# Patient Record
Sex: Female | Born: 1960 | Race: White | Hispanic: No | Marital: Married | Smoking: Current every day smoker
Health system: Southern US, Community
[De-identification: ages and names within clinical notes are randomized; demographics above are authoritative.]

## PROBLEM LIST (undated history)

## (undated) DIAGNOSIS — F419 Anxiety disorder, unspecified: Secondary | ICD-10-CM

## (undated) DIAGNOSIS — T7840XA Allergy, unspecified, initial encounter: Secondary | ICD-10-CM

## (undated) DIAGNOSIS — M2669 Other specified disorders of temporomandibular joint: Secondary | ICD-10-CM

## (undated) DIAGNOSIS — H269 Unspecified cataract: Secondary | ICD-10-CM

## (undated) DIAGNOSIS — M26649 Arthritis of unspecified temporomandibular joint: Secondary | ICD-10-CM

## (undated) HISTORY — DX: Other specified disorders of temporomandibular joint: M26.69

## (undated) HISTORY — PX: APPENDECTOMY: SHX54

## (undated) HISTORY — PX: COSMETIC SURGERY: SHX468

## (undated) HISTORY — DX: Anxiety disorder, unspecified: F41.9

## (undated) HISTORY — DX: Arthritis of unspecified temporomandibular joint: M26.649

## (undated) HISTORY — DX: Unspecified cataract: H26.9

## (undated) HISTORY — PX: BREAST SURGERY: SHX581

## (undated) HISTORY — DX: Allergy, unspecified, initial encounter: T78.40XA

---

## 2018-02-15 ENCOUNTER — Other Ambulatory Visit: Payer: Self-pay

## 2018-02-15 ENCOUNTER — Ambulatory Visit: Payer: Self-pay | Admitting: Family Medicine

## 2018-02-15 ENCOUNTER — Encounter: Payer: Self-pay | Admitting: Family Medicine

## 2018-02-15 VITALS — BP 108/72 | HR 86 | Temp 98.0°F | Resp 16 | Ht 67.72 in | Wt 169.0 lb

## 2018-02-15 DIAGNOSIS — J302 Other seasonal allergic rhinitis: Secondary | ICD-10-CM

## 2018-02-15 DIAGNOSIS — F411 Generalized anxiety disorder: Secondary | ICD-10-CM

## 2018-02-15 DIAGNOSIS — H00012 Hordeolum externum right lower eyelid: Secondary | ICD-10-CM

## 2018-02-15 DIAGNOSIS — R062 Wheezing: Secondary | ICD-10-CM

## 2018-02-15 MED ORDER — ALBUTEROL SULFATE HFA 108 (90 BASE) MCG/ACT IN AERS: 1.0000 | INHALATION_SPRAY | RESPIRATORY_TRACT | 0 refills | Status: DC | PRN

## 2018-02-15 MED ORDER — ERYTHROMYCIN 5 MG/GM OP OINT: 1.0000 "application " | TOPICAL_OINTMENT | Freq: Three times a day (TID) | OPHTHALMIC | 0 refills | Status: DC

## 2018-02-15 MED ORDER — LORAZEPAM 1 MG PO TABS: 1.0000 mg | ORAL_TABLET | Freq: Every day | ORAL | 0 refills | Status: DC | PRN

## 2018-02-15 NOTE — Progress Notes (Signed)
Subjective:  By signing my name below, I, Rachel Weber, attest that this documentation has been prepared under the direction and in the presence of Rachel Raspberry Ranell Patrick, MD.  Electronically Signed: Theresia Weber, Medical Scribe 02/15/18 at 2:44 PM   Patient ID: Rachel Weber, female    DOB: 01/13/1961, 57 y.o.   MRN: [id number] Chief Complaint  Patient presents with  . Establish Care    pt just moved here and need PCP  . Eye Pain    pt states something flew into right eye about 3 days ago now painful and red   HPI Rachel Weber is a 57 y.o. female who presents to [facility] is a new patient here to establish care. She reports concerns of right eye discomfort and irching. Per pt, 3 weeks ago she noticed a piece of wood in her eye and it was removed. Her house is also under construction and she reports that dust has been irritating her eye for the past 3 days. She has tried warm compresses about 5 times a day and some antibiotic drops. She endorses a hx of cataracts. She denies visual disturbance.   Anxiety She states she takes sublingual Ativan 1 mg every day or every other day. She is not on any daily SSRI. She is currently dealing with stress of moving here from [place]. She was prescribed 120 tablets on 09/18/17 and has just run out. She use to take it twice daily but has titrated down in the past 2 months.   Allergies  She states she has a reactive airway when the seasons change or she is sick with bronchitis. She uses a Salbutamol inhaler about once a week. She also notes she was diagnosed with pleurisy in the past and had wheezing where she also used the inhaler. She denies a diagnosis of asthma or COPD. She endorses tobacco use for the past 20 years.    Socially she is from [place] and dealing with visa and citizenship. She states she will be here for 6 months until 06/2018 and then in [place] for 3 months before moving back here perfectly.   There are no active problems to display  for this patient.  Past Medical History:  Diagnosis Date  . Allergy   . Anxiety   . Cataract   . TMJ arthritis    Past Surgical History:  Procedure Laterality Date  . APPENDECTOMY    . BREAST SURGERY    . COSMETIC SURGERY     No Known Allergies Prior to Admission medications   Not on File   Social History   Socioeconomic History  . Marital status: Married    Spouse name: Not on file  . Number of children: Not on file  . Years of education: Not on file  . Highest education level: Not on file  Social Needs  . Financial resource strain: Not on file  . Food insecurity - worry: Not on file  . Food insecurity - inability: Not on file  . Transportation needs - medical: Not on file  . Transportation needs - non-medical: Not on file  Occupational History  . Not on file  Tobacco Use  . Smoking status: Current Every Day Smoker  . Smokeless tobacco: Never Used  Substance and Sexual Activity  . Alcohol use: Not on file  . Drug use: Not on file  . Sexual activity: Not on file  Other Topics Concern  . Not on file  Social History Narrative  .  Not on file   Review of Systems  Eyes: Positive for redness and itching. Negative for visual disturbance.  Psychiatric/Behavioral: The patient is nervous/anxious.        Objective:   Physical Exam  Constitutional: She is oriented to person, place, and time. She appears well-developed and well-nourished. No distress.  HENT:  Head: Normocephalic and atraumatic.  Eyes: Pupils are equal, round, and reactive to light.  Appears to have a stye at the lower middle lid on the right. Right Scleral injection.  Anterior chamber clear,  Proparacaine 2 gtts applied,with less soreness after gtts. Lids everted, no foreign body identified. Fluorescein applied, no uptake.  Flushed with sterile water, no complications.   Cardiovascular: Normal rate and regular rhythm.  Pulmonary/Chest: Effort normal. No respiratory distress. She has no wheezes.    Neurological: She is alert and oriented to person, place, and time.  Skin: She is not diaphoretic.   Vitals:   02/15/18 1327  BP: 108/72  Pulse: 86  Resp: 16  Temp: 98 F (36.7 C)  TempSrc: Oral  SpO2: 96%  Weight: 169 lb (76.7 kg)  Height: 5' 7.72" (1.72 m)       Visual Acuity Screening   Right eye Left eye Both eyes  Without correction: 20/50 20/40 20/40   With correction:       Assessment & Plan:   Rachel Weber is a 57 y.o. female Generalized anxiety disorder - Plan: LORazepam (ATIVAN) 1 MG tablet  - Discussed likely need for daily SSRI if frequent benzodiazepine dosing. Plans to return to discuss the symptoms and history further. Agreed to refill Ativan temporarily for now.  Wheezing - Plan: albuterol (PROVENTIL HFA;VENTOLIN HFA) 108 (90 Base) MCG/ACT inhaler  -Intermittent symptoms. New albuterol prescription provided but RTC precautions given if frequent use as may need inhaled corticosteroid. Trigger avoidance during allergy season discussed  Seasonal allergies  -Symptomatic care and avoidance measures discussed as above and handout given  Hordeolum externum of right lower eyelid - Plan: erythromycin (ROMYCIN) ophthalmic ointment  -Suspected early stye. Less likely infectious, but erythromycin ointment given, warm compresses discussed and RTC precautions  Meds ordered this encounter  Medications  . LORazepam (ATIVAN) 1 MG tablet    Sig: Take 1 tablet (1 mg total) by mouth daily as needed for anxiety.    Dispense:  30 tablet    Refill:  0  . albuterol (PROVENTIL HFA;VENTOLIN HFA) 108 (90 Base) MCG/ACT inhaler    Sig: Inhale 1-2 puffs into the lungs every 4 (four) hours as needed for wheezing or shortness of breath.    Dispense:  1 Inhaler    Refill:  0  . erythromycin (ROMYCIN) ophthalmic ointment    Sig: Place 1 application into the right eye 3 (three) times daily.    Dispense:  3.5 g    Refill:  0   Patient Instructions   I will refill the Ativan for now,  but follow up before that runs out to discuss medications and anxiety treatment.   Albuterol if needed for wheezing.  If you require that more than 2 times per week or any nighttime symptoms - please return to discuss other treatment. At some point it would be helpful to do breathing tests to determine cause of symptoms (I.e asthma or COPD).   For allergies, try flonase nasal spray or over the counter antihistamine such as claritin if needed.    Area on lower lid appears to be a stye. See information below. Erythromycin ointment up to  3 times per day for now. If worsening symptoms, return for recheck. Warm compresses at least 3 times per day.  Stye A stye is a bump on your eyelid caused by a bacterial infection. A stye can form inside the eyelid (internal stye) or outside the eyelid (external stye). An internal stye may be caused by an infected oil-producing gland inside your eyelid. An external stye may be caused by an infection at the base of your eyelash (hair follicle). Styes are very common. Anyone can get them at any age. They usually occur in just one eye, but you may have more than one in either eye. What are the causes? The infection is almost always caused by bacteria called Staphylococcus aureus. This is a common type of bacteria that lives on your skin. What increases the risk? You may be at higher risk for a stye if you have had one before. You may also be at higher risk if you have:  Diabetes.  Long-term illness.  Long-term eye redness.  A skin condition called seborrhea.  High fat levels in your blood (lipids).  What are the signs or symptoms? Eyelid pain is the most common symptom of a stye. Internal styes are more painful than external styes. Other signs and symptoms may include:  Painful swelling of your eyelid.  A scratchy feeling in your eye.  Tearing and redness of your eye.  Pus draining from the stye.  How is this diagnosed? Your health care provider may  be able to diagnose a stye just by examining your eye. The health care provider may also check to make sure:  You do not have a fever or other signs of a more serious infection.  The infection has not spread to other parts of your eye or areas around your eye.  How is this treated? Most styes will clear up in a few days without treatment. In some cases, you may need to use antibiotic drops or ointment to prevent infection. Your health care provider may have to drain the stye surgically if your stye is:  Large.  Causing a lot of pain.  Interfering with your vision.  This can be done using a thin blade or a needle. Follow these instructions at home:  Take medicines only as directed by your health care provider.  Apply a clean, warm compress to your eye for 10 minutes, 4 times a day.  Do not wear contact lenses or eye makeup until your stye has healed.  Do not try to pop or drain the stye. Contact a health care provider if:  You have chills or a fever.  Your stye does not go away after several days.  Your stye affects your vision.  Your eyeball becomes swollen, red, or painful. This information is not intended to replace advice given to you by your health care provider. Make sure you discuss any questions you have with your health care provider. Document Released: 09/14/2005 Document Revised: 07/31/2016 Document Reviewed: 03/21/2014 Elsevier Interactive Patient Education  2018 [employer].   Allergic Rhinitis, Adult Allergic rhinitis is an allergic reaction that affects the mucous membrane inside the nose. It causes sneezing, a runny or stuffy nose, and the feeling of mucus going down the back of the throat (postnasal drip). Allergic rhinitis can be mild to severe. There are two types of allergic rhinitis:  Seasonal. This type is also called hay fever. It happens only during certain seasons.  Perennial. This type can happen at any time of the  year.  What are the  causes? This condition happens when the body's defense system (immune system) responds to certain harmless substances called allergens as though they were germs.  Seasonal allergic rhinitis is triggered by pollen, which can come from grasses, trees, and weeds. Perennial allergic rhinitis may be caused by:  House dust mites.  Pet dander.  Mold spores.  What are the signs or symptoms? Symptoms of this condition include:  Sneezing.  Runny or stuffy nose (nasal congestion).  Postnasal drip.  Itchy nose.  Tearing of the eyes.  Trouble sleeping.  Daytime sleepiness.  How is this diagnosed? This condition may be diagnosed based on:  Your medical history.  A physical exam.  Tests to check for related conditions, such as: ? Asthma. ? Pink eye. ? Ear infection. ? Upper respiratory infection.  Tests to find out which allergens trigger your symptoms. These may include skin or blood tests.  How is this treated? There is no cure for this condition, but treatment can help control symptoms. Treatment may include:  Taking medicines that block allergy symptoms, such as antihistamines. Medicine may be given as a shot, nasal spray, or pill.  Avoiding the allergen.  Desensitization. This treatment involves getting ongoing shots until your body becomes less sensitive to the allergen. This treatment may be done if other treatments do not help.  If taking medicine and avoiding the allergen does not work, new, stronger medicines may be prescribed.  Follow these instructions at home:  Find out what you are allergic to. Common allergens include smoke, dust, and pollen.  Avoid the things you are allergic to. These are some things you can do to help avoid allergens: ? Replace carpet with wood, tile, or vinyl flooring. Carpet can trap dander and dust. ? Do not smoke. Do not allow smoking in your home. ? Change your heating and air conditioning filter at least once a month. ? During  allergy season:  Keep windows closed as much as possible.  Plan outdoor activities when pollen counts are lowest. This is usually during the evening hours.  When coming indoors, change clothing and shower before sitting on furniture or bedding.  Take over-the-counter and prescription medicines only as told by your health care provider.  Keep all follow-up visits as told by your health care provider. This is important. Contact a health care provider if:  You have a fever.  You develop a persistent cough.  You make whistling sounds when you breathe (you wheeze).  Your symptoms interfere with your normal daily activities. Get help right away if:  You have shortness of breath. Summary  This condition can be managed by taking medicines as directed and avoiding allergens.  Contact your health care provider if you develop a persistent cough or fever.  During allergy season, keep windows closed as much as possible. This information is not intended to replace advice given to you by your health care provider. Make sure you discuss any questions you have with your health care provider. Document Released: 08/30/2001 Document Revised: 01/12/2017 Document Reviewed: 01/12/2017 Elsevier Interactive Patient Education  2018 [employer].     IF you received an x-ray today, you will receive an invoice from [facility]. Please contact [facility] at [phone] with questions or concerns regarding your invoice.   IF you received labwork today, you will receive an invoice from [place]. Please contact [facility] at [phone] with questions or concerns regarding your invoice.   Our billing staff will not be able to  assist you with questions regarding bills from these companies.  You will be contacted with the lab results as soon as they are available. The fastest way to get your results is to activate your My Chart account. Instructions are located on the last page of this  paperwork. If you have not heard from [place] regarding the results in 2 weeks, please contact this office.         I personally performed the services described in this documentation, which was scribed in my presence. The recorded information has been reviewed and considered for accuracy and completeness, addended by me as needed, and agree with information above.  Signed,   Merri Ray, MD [facility].  02/17/18 11:53 AM

## 2018-02-15 NOTE — Patient Instructions (Addendum)
I will refill the Ativan for now, but follow up before that runs out to discuss medications and anxiety treatment.   Albuterol if needed for wheezing.  If you require that more than 2 times per week or any nighttime symptoms - please return to discuss other treatment. At some point it would be helpful to do breathing tests to determine cause of symptoms (I.e asthma or COPD).   For allergies, try flonase nasal spray or over the counter antihistamine such as claritin if needed.    Area on lower lid appears to be a stye. See information below. Erythromycin ointment up to 3 times per day for now. If worsening symptoms, return for recheck. Warm compresses at least 3 times per day.  Stye A stye is a bump on your eyelid caused by a bacterial infection. A stye can form inside the eyelid (internal stye) or outside the eyelid (external stye). An internal stye may be caused by an infected oil-producing gland inside your eyelid. An external stye may be caused by an infection at the base of your eyelash (hair follicle). Styes are very common. Anyone can get them at any age. They usually occur in just one eye, but you may have more than one in either eye. What are the causes? The infection is almost always caused by bacteria called Staphylococcus aureus. This is a common type of bacteria that lives on your skin. What increases the risk? You may be at higher risk for a stye if you have had one before. You may also be at higher risk if you have:  Diabetes.  Long-term illness.  Long-term eye redness.  A skin condition called seborrhea.  High fat levels in your blood (lipids).  What are the signs or symptoms? Eyelid pain is the most common symptom of a stye. Internal styes are more painful than external styes. Other signs and symptoms may include:  Painful swelling of your eyelid.  A scratchy feeling in your eye.  Tearing and redness of your eye.  Pus draining from the stye.  How is this  diagnosed? Your health care provider may be able to diagnose a stye just by examining your eye. The health care provider may also check to make sure:  You do not have a fever or other signs of a more serious infection.  The infection has not spread to other parts of your eye or areas around your eye.  How is this treated? Most styes will clear up in a few days without treatment. In some cases, you may need to use antibiotic drops or ointment to prevent infection. Your health care provider may have to drain the stye surgically if your stye is:  Large.  Causing a lot of pain.  Interfering with your vision.  This can be done using a thin blade or a needle. Follow these instructions at home:  Take medicines only as directed by your health care provider.  Apply a clean, warm compress to your eye for 10 minutes, 4 times a day.  Do not wear contact lenses or eye makeup until your stye has healed.  Do not try to pop or drain the stye. Contact a health care provider if:  You have chills or a fever.  Your stye does not go away after several days.  Your stye affects your vision.  Your eyeball becomes swollen, red, or painful. This information is not intended to replace advice given to you by your health care provider. Make sure you discuss any  questions you have with your health care provider. Document Released: 09/14/2005 Document Revised: 07/31/2016 Document Reviewed: 03/21/2014 Elsevier Interactive Patient Education  2018 [employer].   Allergic Rhinitis, Adult Allergic rhinitis is an allergic reaction that affects the mucous membrane inside the nose. It causes sneezing, a runny or stuffy nose, and the feeling of mucus going down the back of the throat (postnasal drip). Allergic rhinitis can be mild to severe. There are two types of allergic rhinitis:  Seasonal. This type is also called hay fever. It happens only during certain seasons.  Perennial. This type can happen at any  time of the year.  What are the causes? This condition happens when the body's defense system (immune system) responds to certain harmless substances called allergens as though they were germs.  Seasonal allergic rhinitis is triggered by pollen, which can come from grasses, trees, and weeds. Perennial allergic rhinitis may be caused by:  House dust mites.  Pet dander.  Mold spores.  What are the signs or symptoms? Symptoms of this condition include:  Sneezing.  Runny or stuffy nose (nasal congestion).  Postnasal drip.  Itchy nose.  Tearing of the eyes.  Trouble sleeping.  Daytime sleepiness.  How is this diagnosed? This condition may be diagnosed based on:  Your medical history.  A physical exam.  Tests to check for related conditions, such as: ? Asthma. ? Pink eye. ? Ear infection. ? Upper respiratory infection.  Tests to find out which allergens trigger your symptoms. These may include skin or blood tests.  How is this treated? There is no cure for this condition, but treatment can help control symptoms. Treatment may include:  Taking medicines that block allergy symptoms, such as antihistamines. Medicine may be given as a shot, nasal spray, or pill.  Avoiding the allergen.  Desensitization. This treatment involves getting ongoing shots until your body becomes less sensitive to the allergen. This treatment may be done if other treatments do not help.  If taking medicine and avoiding the allergen does not work, new, stronger medicines may be prescribed.  Follow these instructions at home:  Find out what you are allergic to. Common allergens include smoke, dust, and pollen.  Avoid the things you are allergic to. These are some things you can do to help avoid allergens: ? Replace carpet with wood, tile, or vinyl flooring. Carpet can trap dander and dust. ? Do not smoke. Do not allow smoking in your home. ? Change your heating and air conditioning filter at  least once a month. ? During allergy season:  Keep windows closed as much as possible.  Plan outdoor activities when pollen counts are lowest. This is usually during the evening hours.  When coming indoors, change clothing and shower before sitting on furniture or bedding.  Take over-the-counter and prescription medicines only as told by your health care provider.  Keep all follow-up visits as told by your health care provider. This is important. Contact a health care provider if:  You have a fever.  You develop a persistent cough.  You make whistling sounds when you breathe (you wheeze).  Your symptoms interfere with your normal daily activities. Get help right away if:  You have shortness of breath. Summary  This condition can be managed by taking medicines as directed and avoiding allergens.  Contact your health care provider if you develop a persistent cough or fever.  During allergy season, keep windows closed as much as possible. This information is not intended to replace  advice given to you by your health care provider. Make sure you discuss any questions you have with your health care provider. Document Released: 08/30/2001 Document Revised: 01/12/2017 Document Reviewed: 01/12/2017 Elsevier Interactive Patient Education  2018 [employer].     IF you received an x-ray today, you will receive an invoice from [facility]. Please contact [facility] at [phone] with questions or concerns regarding your invoice.   IF you received labwork today, you will receive an invoice from [place]. Please contact [facility] at [phone] with questions or concerns regarding your invoice.   Our billing staff will not be able to assist you with questions regarding bills from these companies.  You will be contacted with the lab results as soon as they are available. The fastest way to get your results is to activate your My Chart account. Instructions are  located on the last page of this paperwork. If you have not heard from [place] regarding the results in 2 weeks, please contact this office.

## 2018-02-17 DIAGNOSIS — F411 Generalized anxiety disorder: Secondary | ICD-10-CM | POA: Insufficient documentation

## 2018-02-17 DIAGNOSIS — J302 Other seasonal allergic rhinitis: Secondary | ICD-10-CM | POA: Insufficient documentation

## 2018-02-17 DIAGNOSIS — R062 Wheezing: Secondary | ICD-10-CM | POA: Insufficient documentation

## 2018-03-13 ENCOUNTER — Telehealth: Payer: Self-pay | Admitting: Family Medicine

## 2018-03-13 ENCOUNTER — Ambulatory Visit: Payer: Self-pay | Admitting: Family Medicine

## 2018-03-13 ENCOUNTER — Encounter: Payer: Self-pay | Admitting: Family Medicine

## 2018-03-13 ENCOUNTER — Other Ambulatory Visit: Payer: Self-pay

## 2018-03-13 VITALS — BP 124/77 | HR 86 | Temp 98.0°F | Resp 18 | Ht 67.56 in | Wt 171.2 lb

## 2018-03-13 DIAGNOSIS — F411 Generalized anxiety disorder: Secondary | ICD-10-CM

## 2018-03-13 DIAGNOSIS — H00012 Hordeolum externum right lower eyelid: Secondary | ICD-10-CM

## 2018-03-13 DIAGNOSIS — M2550 Pain in unspecified joint: Secondary | ICD-10-CM

## 2018-03-13 MED ORDER — LORAZEPAM 1 MG PO TABS: 1.0000 mg | ORAL_TABLET | Freq: Two times a day (BID) | ORAL | 0 refills | Status: DC | PRN

## 2018-03-13 MED ORDER — ERYTHROMYCIN 5 MG/GM OP OINT: 1.0000 "application " | TOPICAL_OINTMENT | Freq: Three times a day (TID) | OPHTHALMIC | 0 refills | Status: DC

## 2018-03-13 MED ORDER — MELOXICAM 7.5 MG PO TABS: 7.5000 mg | ORAL_TABLET | Freq: Every day | ORAL | 0 refills | Status: DC

## 2018-03-13 MED ORDER — HYDROXYZINE HCL 25 MG PO TABS: 12.5000 mg | ORAL_TABLET | Freq: Three times a day (TID) | ORAL | 0 refills | Status: DC | PRN

## 2018-03-13 NOTE — Telephone Encounter (Signed)
03/13/2018 - PATIENT SAW DR. Carlota Raspberry TODAY FOR A FOLLOW-UP VISIT ON HER ANXIETY. I TRIED TO CALL HER TO LET HER KNOW THAT DR. GREENE WANTS HER TO RETURN FOR AN OFFICE VISIT IF SHE NEEDS TO BE SEEN FOR ORTHOPAEDIC ISSUES. IF SHE CALLS BACK PLEASE ALSO UPDATE HER PHONE NUMBER. WE HAVE [phone] WHICH SAYS IT IS AN INVALID PHONE NUMBER. [place]

## 2018-03-13 NOTE — Patient Instructions (Addendum)
I can refill the antibiotic ointment, but see information on stye below. It may be worth meeting with an eye specialist to decide if treatment needs to be adjusted if current area dose not improve in next 2 weeks.   I'm sorry to hear about all the life stressors right now. Ativan as needed - up to twice per day. You can try the hydroxyzine in place of the Ativan as that may be less addictive (one or the other).  Do not combine that medicine with other sedating medications. I would consider SSRI or venlafaxine for anxiety if you continue to need the ativan or hydroxyzine most days per week. PLease follow up in next 3 months to discuss those medications further.   I did write a prescription antiinflammatory for aches and pains, but please return to discuss those areas further.   Return to the clinic or go to the nearest emergency room if any of your symptoms worsen or new symptoms occur.  Thank you for coming in today.   Stye A stye is a bump on your eyelid caused by a bacterial infection. A stye can form inside the eyelid (internal stye) or outside the eyelid (external stye). An internal stye may be caused by an infected oil-producing gland inside your eyelid. An external stye may be caused by an infection at the base of your eyelash (hair follicle). Styes are very common. Anyone can get them at any age. They usually occur in just one eye, but you may have more than one in either eye. What are the causes? The infection is almost always caused by bacteria called Staphylococcus aureus. This is a common type of bacteria that lives on your skin. What increases the risk? You may be at higher risk for a stye if you have had one before. You may also be at higher risk if you have:  Diabetes.  Long-term illness.  Long-term eye redness.  A skin condition called seborrhea.  High fat levels in your blood (lipids).  What are the signs or symptoms? Eyelid pain is the most common symptom of a stye.  Internal styes are more painful than external styes. Other signs and symptoms may include:  Painful swelling of your eyelid.  A scratchy feeling in your eye.  Tearing and redness of your eye.  Pus draining from the stye.  How is this diagnosed? Your health care provider may be able to diagnose a stye just by examining your eye. The health care provider may also check to make sure:  You do not have a fever or other signs of a more serious infection.  The infection has not spread to other parts of your eye or areas around your eye.  How is this treated? Most styes will clear up in a few days without treatment. In some cases, you may need to use antibiotic drops or ointment to prevent infection. Your health care provider may have to drain the stye surgically if your stye is:  Large.  Causing a lot of pain.  Interfering with your vision.  This can be done using a thin blade or a needle. Follow these instructions at home:  Take medicines only as directed by your health care provider.  Apply a clean, warm compress to your eye for 10 minutes, 4 times a day.  Do not wear contact lenses or eye makeup until your stye has healed.  Do not try to pop or drain the stye. Contact a health care provider if:  You have  chills or a fever.  Your stye does not go away after several days.  Your stye affects your vision.  Your eyeball becomes swollen, red, or painful. This information is not intended to replace advice given to you by your health care provider. Make sure you discuss any questions you have with your health care provider. Document Released: 09/14/2005 Document Revised: 07/31/2016 Document Reviewed: 03/21/2014 Elsevier Interactive Patient Education  2018 [employer].     IF you received an x-ray today, you will receive an invoice from [facility] Radiology. Please contact [facility] Radiology at [phone] with questions or concerns regarding your invoice.   IF you  received labwork today, you will receive an invoice from [facility]. Please contact [facility] at [phone] with questions or concerns regarding your invoice.   Our billing staff will not be able to assist you with questions regarding bills from these companies.  You will be contacted with the lab results as soon as they are available. The fastest way to get your results is to activate your My Chart account. Instructions are located on the last page of this paperwork. If you have not heard from [place] regarding the results in 2 weeks, please contact this office.

## 2018-03-13 NOTE — Progress Notes (Signed)
Subjective:  By signing my name below, I, Rachel Weber, attest that this documentation has been prepared under the direction and in the presence of [name], MD Electronically Signed: Ladene Artist, ED Scribe 03/13/2018 at 10:15 AM.   Patient ID: Rachel Weber, female    DOB: 1961-06-03, 57 y.o.   MRN: [id number]  Chief Complaint  Patient presents with  . Anxiety     recheck anxiety and allergies- SCEERNING DONE   HPI Rachel Weber is a 57 y.o. female who presents to Primary Care at [facility] for f/u on anxiety. Last seen 2/28 to establish care but was having R eye discomfort at that time. Suspected early stye, erythromycin ointment given. Warm compresses and RTC precautions. No uptake with staining.  Today, pt states that the first stye resolved after 2 wks but another stye started after the first one resolved. Reports mild pain to the stye on the R eye and a lot of R eye watering. She has completed the ointment and has also been using warm compresses.  Generalized Anxiety Disorder Recently moved here from [place]. Had taken ativan 1 mg previously twice daily but had titrated down in prior 2 months. #120 lasted from 09/18/17 to last visit. She was not on daily SSRI. Agreed to temporarily refill ativan 1 mg #30 with potential need for SSRI discussed.  She reports that she is currently under a lot of stress with moving here and her mother being ill and undergoing heart surgery. Pt has been taking ativan once a day and has ~5 ativan tabs remaining. She has never tried any SSRIs; states her previous PCP was completely against it as she has never had depression. Reports she has never been diagnosed with bipolar, depression or anything else other than anxiety which she states is situational. Reports she has struggled with anxiety for the past 6 months but planned to wean herself off of ativan. Also reports some fatigue, restlessness and difficulty sleeping. Pt was prescribed temazepam 15 mg that she  takes once/wk prescribed 09/02/17 #120. She has not met with a therapist due to finances but states she has a good support system. Denies SI.  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 She was taking Tylenol with codeine for foot pain from previous fracture, knee pain and shoulder pain. She has weaned herself off of Tylenol 3. Currently taking Aleve without relief. Denies h/o cardiac related issues or stomach ulcers.  Depression screen [facility] 2/9 03/13/2018 03/13/2018 02/15/2018  Decreased Interest 0 0 0  Down, Depressed, Hopeless 0 0 0  PHQ - 2 Score 0 0 0  Altered sleeping 1 - -  Tired, decreased energy 1 - -  Change in appetite 0 - -  Feeling bad or failure about yourself  0 - -  Trouble concentrating 0 - -  Moving slowly or fidgety/restless 0 - -  Suicidal thoughts 0 - -  PHQ-9 Score 2 - -  Difficult doing work/chores Not difficult at all - -   GAD 7 : Generalized Anxiety Score 03/13/2018  Nervous, Anxious, on Edge 3  Control/stop worrying 1  Worry too much - different things 0  Trouble relaxing 1  Restless 0  Easily annoyed or irritable 1  Afraid - awful might happen 0  Total GAD 7 Score 6  Anxiety Difficulty Not difficult at all   Allergic Rhinitis with Episodic Wheezing Albuterol  provided if needed with RTC precautions if persistent use. Symptomatic care discussed for allergies.  Patient Active Problem List   Diagnosis Date Noted  . Generalized anxiety disorder 02/17/2018  . Wheezing 02/17/2018  . Seasonal allergies 02/17/2018   Past Medical History:  Diagnosis Date  . Allergy   . Anxiety   . Cataract   . TMJ arthritis    Past Surgical History:  Procedure Laterality Date  . APPENDECTOMY    . BREAST SURGERY    . COSMETIC SURGERY     No Known Allergies Prior to Admission medications   Medication Sig Start Date End Date Taking? Authorizing Provider  albuterol (PROVENTIL  HFA;VENTOLIN HFA) 108 (90 Base) MCG/ACT inhaler Inhale 1-2 puffs into the lungs every 4 (four) hours as needed for wheezing or shortness of breath. 02/15/18   [name], MD  erythromycin [facility]) ophthalmic ointment Place 1 application into the right eye 3 (three) times daily. 02/15/18   [name], MD  LORazepam (ATIVAN) 1 MG tablet Take 1 tablet (1 mg total) by mouth daily as needed for anxiety. 02/15/18   [name], MD   Social History   Socioeconomic History  . Marital status: Married    Spouse name: Not on file  . Number of children: 1  . Years of education: Not on file  . Highest education level: Not on file  Occupational History  . Not on file  Social Needs  . Financial resource strain: Not on file  . Food insecurity:    Worry: Not on file    Inability: Not on file  . Transportation needs:    Medical: Not on file    Non-medical: Not on file  Tobacco Use  . Smoking status: Current Every Day Smoker    Packs/day: 0.25    Types: Cigarettes  . Smokeless tobacco: Never Used  Substance and Sexual Activity  . Alcohol use: Yes    Comment: occ  . Drug use: Not Currently  . Sexual activity: Yes  Lifestyle  . Physical activity:    Days per week: Not on file    Minutes per session: Not on file  . Stress: Not on file  Relationships  . Social connections:    Talks on phone: Not on file    Gets together: Not on file    Attends religious service: Not on file    Active member of club or organization: Not on file    Attends meetings of clubs or organizations: Not on file    Relationship status: Not on file  . Intimate partner violence:    Fear of current or ex partner: Not on file    Emotionally abused: Not on file    Physically abused: Not on file    Forced sexual activity: Not on file  Other Topics Concern  . Not on file  Social History Narrative  . Not on file   Review of Systems  Constitutional: Positive for fatigue.  Eyes: Positive for pain  (mild; stye) and discharge (watering).  Musculoskeletal: Positive for arthralgias and myalgias.  Psychiatric/Behavioral: Positive for sleep disturbance. Negative for dysphoric mood and suicidal ideas. The patient is nervous/anxious.       Objective:   Physical Exam  Constitutional: She is oriented to person, place, and time. She appears well-developed and well-nourished. No distress.  HENT:  Head: Normocephalic and atraumatic.  Eyes: Conjunctivae and EOM are normal.  Small stye on mid aspect of R lower lid. No significant erythema  or edema surrounding.  Neck: Neck supple. No tracheal deviation present.  Cardiovascular: Normal rate and regular rhythm.  Pulmonary/Chest: Effort normal and breath sounds normal. No respiratory distress.  Musculoskeletal: Normal range of motion.  Lymphadenopathy:       Head (right side): No preauricular adenopathy present.       Head (left side): No preauricular adenopathy present.  Neurological: She is alert and oriented to person, place, and time.  Skin: Skin is warm and dry.  Psychiatric: She has a normal mood and affect. Her behavior is normal.  Nursing note and vitals reviewed.  Vitals:   03/13/18 0947  BP: 124/77  Pulse: 86  Resp: 18  Temp: 98 F (36.7 C)  TempSrc: Oral  SpO2: 97%  Weight: 171 lb 3.2 oz (77.7 kg)  Height: 5' 7.56" (1.716 m)      Assessment & Plan:   Iysha Mishkin is a 57 y.o. female Polyarthralgia - Plan: meloxicam (MOBIC) 7.5 MG tablet  -Various arthralgias, and agreed to prescribe meloxicam as needed for now with follow-up to examine specific areas further as well as discuss previous treatments  Hordeolum externum of right lower eyelid - Plan: erythromycin [facility]) ophthalmic ointment  -Mild/minimal swelling, warm compresses discussed, similar treatment as last time, refilled erythromycin ointment.  If persistent symptoms or recurrent symptoms would recommend evaluation with ophthalmology  Generalized anxiety disorder -  Plan: hydrOXYzine (ATARAX/VISTARIL) 25 MG tablet, LORazepam (ATIVAN) 1 MG tablet  -Suspected generalized anxiety with worsening with situational stressors.  Long-standing anxiety, has been able to cut back somewhat on her benzodiazepine.  Discussed concerns with solitary treatment with benzodiazepine, and risk for addiction.  -For now agreed on Ativan 1 mg twice daily as needed, but also recommended trying hydroxyzine in its place as less addictive.  Would consider SSRI if persistent need for anxiolytic.  Recheck status within 3 months, sooner if worse.   Meds ordered this encounter  Medications  . erythromycin [facility]) ophthalmic ointment    Sig: Place 1 application into the right eye 3 (three) times daily.    Dispense:  3.5 g    Refill:  0  . hydrOXYzine (ATARAX/VISTARIL) 25 MG tablet    Sig: Take 0.5-1 tablets (12.5-25 mg total) by mouth 3 (three) times daily as needed for anxiety.    Dispense:  30 tablet    Refill:  0  . LORazepam (ATIVAN) 1 MG tablet    Sig: Take 1 tablet (1 mg total) by mouth 2 (two) times daily as needed for anxiety.    Dispense:  60 tablet    Refill:  0  . meloxicam (MOBIC) 7.5 MG tablet    Sig: Take 1 tablet (7.5 mg total) by mouth daily.    Dispense:  30 tablet    Refill:  0   Patient Instructions   I can refill the antibiotic ointment, but see information on stye below. It may be worth meeting with an eye specialist to decide if treatment needs to be adjusted if current area dose not improve in next 2 weeks.   I'm sorry to hear about all the life stressors right now. Ativan as needed - up to twice per day. You can try the hydroxyzine in place of the Ativan as that may be less addictive (one or the other).  Do not combine that medicine with other sedating medications. I would consider SSRI or venlafaxine for anxiety if you continue to need the ativan or hydroxyzine most days per week. PLease follow up in  next 3 months to discuss those medications further.   I  did write a prescription antiinflammatory for aches and pains, but please return to discuss those areas further.   Return to the clinic or go to the nearest emergency room if any of your symptoms worsen or new symptoms occur.  Thank you for coming in today.   Stye A stye is a bump on your eyelid caused by a bacterial infection. A stye can form inside the eyelid (internal stye) or outside the eyelid (external stye). An internal stye may be caused by an infected oil-producing gland inside your eyelid. An external stye may be caused by an infection at the base of your eyelash (hair follicle). Styes are very common. Anyone can get them at any age. They usually occur in just one eye, but you may have more than one in either eye. What are the causes? The infection is almost always caused by bacteria called Staphylococcus aureus. This is a common type of bacteria that lives on your skin. What increases the risk? You may be at higher risk for a stye if you have had one before. You may also be at higher risk if you have:  Diabetes.  Long-term illness.  Long-term eye redness.  A skin condition called seborrhea.  High fat levels in your blood (lipids).  What are the signs or symptoms? Eyelid pain is the most common symptom of a stye. Internal styes are more painful than external styes. Other signs and symptoms may include:  Painful swelling of your eyelid.  A scratchy feeling in your eye.  Tearing and redness of your eye.  Pus draining from the stye.  How is this diagnosed? Your health care provider may be able to diagnose a stye just by examining your eye. The health care provider may also check to make sure:  You do not have a fever or other signs of a more serious infection.  The infection has not spread to other parts of your eye or areas around your eye.  How is this treated? Most styes will clear up in a few days without treatment. In some cases, you may need to use antibiotic  drops or ointment to prevent infection. Your health care provider may have to drain the stye surgically if your stye is:  Large.  Causing a lot of pain.  Interfering with your vision.  This can be done using a thin blade or a needle. Follow these instructions at home:  Take medicines only as directed by your health care provider.  Apply a clean, warm compress to your eye for 10 minutes, 4 times a day.  Do not wear contact lenses or eye makeup until your stye has healed.  Do not try to pop or drain the stye. Contact a health care provider if:  You have chills or a fever.  Your stye does not go away after several days.  Your stye affects your vision.  Your eyeball becomes swollen, red, or painful. This information is not intended to replace advice given to you by your health care provider. Make sure you discuss any questions you have with your health care provider. Document Released: 09/14/2005 Document Revised: 07/31/2016 Document Reviewed: 03/21/2014 Elsevier Interactive Patient Education  2018 [employer].     IF you received an x-ray today, you will receive an invoice from [facility] Radiology. Please contact [facility] Radiology at [phone] with questions or concerns regarding your invoice.   IF you received labwork today, you will receive  an Pharmacologist from [facility]. Please contact [place] at [phone] with questions or concerns regarding your invoice.   Our billing staff will not be able to assist you with questions regarding bills from these companies.  You will be contacted with the lab results as soon as they are available. The fastest way to get your results is to activate your My Chart account. Instructions are located on the last page of this paperwork. If you have not heard from [place] regarding the results in 2 weeks, please contact this office.      I personally performed the services described in this documentation, which was scribed in my presence. The  recorded information has been reviewed and considered for accuracy and completeness, addended by me as needed, and agree with information above.  Signed,   Merri Ray, MD Primary Care at [place].  03/16/18 8:36 AM

## 2018-03-13 NOTE — Telephone Encounter (Signed)
° °  Pt returned call and said she will try something and if she find that it does not work she will call and make a appt. She said the number on file is the correct number and that it is an [place] number and sometimes you have to call the number more than once

## 2018-04-09 ENCOUNTER — Other Ambulatory Visit: Payer: Self-pay | Admitting: Family Medicine

## 2018-04-09 DIAGNOSIS — F411 Generalized anxiety disorder: Secondary | ICD-10-CM

## 2018-04-09 DIAGNOSIS — M2550 Pain in unspecified joint: Secondary | ICD-10-CM

## 2018-04-09 NOTE — Telephone Encounter (Signed)
Last filled: 03/13/18 Last OV: 03/13/18 PCP: [place]: [id number] [address], [place] [phone] (Phone) [phone] (Fax)

## 2018-04-09 NOTE — Telephone Encounter (Signed)
Please advise on refill.

## 2018-04-10 NOTE — Telephone Encounter (Signed)
meds refilled, as recent OV.

## 2018-05-09 ENCOUNTER — Other Ambulatory Visit: Payer: Self-pay | Admitting: Family Medicine

## 2018-05-09 DIAGNOSIS — F411 Generalized anxiety disorder: Secondary | ICD-10-CM

## 2018-05-09 DIAGNOSIS — M2550 Pain in unspecified joint: Secondary | ICD-10-CM

## 2018-05-09 NOTE — Telephone Encounter (Signed)
Refilled meloxicam and hydroxyzine, but should follow up in June

## 2018-05-09 NOTE — Telephone Encounter (Signed)
Refill on these 2 meds below.   Last seen 02/15/18 and 03/13/18 for anxiety and eye lip problems.  Please advise

## 2018-05-24 ENCOUNTER — Other Ambulatory Visit: Payer: Self-pay | Admitting: Family Medicine

## 2018-05-24 DIAGNOSIS — F411 Generalized anxiety disorder: Secondary | ICD-10-CM

## 2018-05-24 NOTE — Telephone Encounter (Signed)
Lorazepam refill Last OV:03/13/18 Last refill:03/13/18 0 refill QAE:SLPNPY Pharmacy: [id number] [address], [place] [phone] (Phone) [phone] (Fax)

## 2018-05-28 ENCOUNTER — Telehealth: Payer: Self-pay | Admitting: Family Medicine

## 2018-05-28 DIAGNOSIS — F411 Generalized anxiety disorder: Secondary | ICD-10-CM

## 2018-05-28 NOTE — Telephone Encounter (Signed)
Copied from [place] [phone]. Topic: Quick Communication - Rx Refill/Question >> May 28, 2018  4:10 PM Selinda Flavin B, NT wrote: Medication: LORazepam (ATIVAN) 1 MG tablet  Has the patient contacted their pharmacy? Yes.   (Agent: If no, request that the patient contact the pharmacy for the refill.) (Agent: If yes, when and what did the pharmacy advise?)  Preferred Pharmacy (with phone number or street name): [id number] [place], [place]  Agent: Please be advised that RX refills may take up to 3 business days. We ask that you follow-up with your pharmacy.

## 2018-05-29 NOTE — Telephone Encounter (Signed)
Ativan 1 mg refill request - looks like it may be pended by Dr. Carlota Raspberry.  LOV 03/13/18 with Dr. Carlota Raspberry  Last refill:  03/13/18  #60    0 refills  [id number] [address] [place] [facility] [place]

## 2018-05-29 NOTE — Telephone Encounter (Signed)
Refilled, but should be due for follow up prior to further refills.

## 2018-05-30 NOTE — Telephone Encounter (Signed)
Please advise on refill. Pt was last seen 03/13/2018.  Controlled.

## 2018-05-30 NOTE — Telephone Encounter (Signed)
Refilled yesterday. 

## 2018-06-11 ENCOUNTER — Ambulatory Visit (INDEPENDENT_AMBULATORY_CARE_PROVIDER_SITE_OTHER): Payer: Self-pay | Admitting: Family Medicine

## 2018-06-11 ENCOUNTER — Ambulatory Visit: Payer: Self-pay | Admitting: Family Medicine

## 2018-06-11 ENCOUNTER — Other Ambulatory Visit: Payer: Self-pay

## 2018-06-11 ENCOUNTER — Encounter: Payer: Self-pay | Admitting: Family Medicine

## 2018-06-11 ENCOUNTER — Telehealth: Payer: Self-pay | Admitting: Family Medicine

## 2018-06-11 DIAGNOSIS — R062 Wheezing: Secondary | ICD-10-CM

## 2018-06-11 DIAGNOSIS — F411 Generalized anxiety disorder: Secondary | ICD-10-CM

## 2018-06-11 MED ORDER — ALBUTEROL SULFATE HFA 108 (90 BASE) MCG/ACT IN AERS: 1.0000 | INHALATION_SPRAY | RESPIRATORY_TRACT | 0 refills | Status: DC | PRN

## 2018-06-11 MED ORDER — HYDROXYZINE HCL 25 MG PO TABS: 25.0000 mg | ORAL_TABLET | Freq: Three times a day (TID) | ORAL | 0 refills | Status: DC | PRN

## 2018-06-11 NOTE — Patient Instructions (Addendum)
Ok to continue hydroxyzine for anxiety, but can also take that at bedtime to help with sleep.   I will need to check into the At[place] and how that can be written with you leaving the country.   Albuterol was refilled if needed. If you require that med more often, I would recommend a different class of meds that you can use daily to lessen the need for albuterol.    Return to the clinic or go to the nearest emergency room if any of your symptoms worsen or new symptoms occur.  Insomnia Insomnia is a sleep disorder that makes it difficult to fall asleep or to stay asleep. Insomnia can cause tiredness (fatigue), low energy, difficulty concentrating, mood swings, and poor performance at work or school. There are three different ways to classify insomnia:  Difficulty falling asleep.  Difficulty staying asleep.  Waking up too early in the morning.  Any type of insomnia can be long-term (chronic) or short-term (acute). Both are common. Short-term insomnia usually lasts for three months or less. Chronic insomnia occurs at least three times a week for longer than three months. What are the causes? Insomnia may be caused by another condition, situation, or substance, such as:  Anxiety.  Certain medicines.  Gastroesophageal reflux disease (GERD) or other gastrointestinal conditions.  Asthma or other breathing conditions.  Restless legs syndrome, sleep apnea, or other sleep disorders.  Chronic pain.  Menopause. This may include hot flashes.  Stroke.  Abuse of alcohol, tobacco, or illegal drugs.  Depression.  Caffeine.  Neurological disorders, such as Alzheimer disease.  An overactive thyroid (hyperthyroidism).  The cause of insomnia may not be known. What increases the risk? Risk factors for insomnia include:  Gender. Women are more commonly affected than men.  Age. Insomnia is more common as you get older.  Stress. This may involve your professional or personal  life.  Income. Insomnia is more common in people with lower income.  Lack of exercise.  Irregular work schedule or night shifts.  Traveling between different time zones.  What are the signs or symptoms? If you have insomnia, trouble falling asleep or trouble staying asleep is the main symptom. This may lead to other symptoms, such as:  Feeling fatigued.  Feeling nervous about going to sleep.  Not feeling rested in the morning.  Having trouble concentrating.  Feeling irritable, anxious, or depressed.  How is this treated? Treatment for insomnia depends on the cause. If your insomnia is caused by an underlying condition, treatment will focus on addressing the condition. Treatment may also include:  Medicines to help you sleep.  Counseling or therapy.  Lifestyle adjustments.  Follow these instructions at home:  Take medicines only as directed by your health care provider.  Keep regular sleeping and waking hours. Avoid naps.  Keep a sleep diary to help you and your health care provider figure out what could be causing your insomnia. Include: ? When you sleep. ? When you wake up during the night. ? How well you sleep. ? How rested you feel the next day. ? Any side effects of medicines you are taking. ? What you eat and drink.  Make your bedroom a comfortable place where it is easy to fall asleep: ? Put up shades or special blackout curtains to block light from outside. ? Use a white noise machine to block noise. ? Keep the temperature cool.  Exercise regularly as directed by your health care provider. Avoid exercising right before bedtime.  Use relaxation  techniques to manage stress. Ask your health care provider to suggest some techniques that may work well for you. These may include: ? Breathing exercises. ? Routines to release muscle tension. ? Visualizing peaceful scenes.  Cut back on alcohol, caffeinated beverages, and cigarettes, especially close to bedtime.  These can disrupt your sleep.  Do not overeat or eat spicy foods right before bedtime. This can lead to digestive discomfort that can make it hard for you to sleep.  Limit screen use before bedtime. This includes: ? Watching TV. ? Using your smartphone, tablet, and computer.  Stick to a routine. This can help you fall asleep faster. Try to do a quiet activity, brush your teeth, and go to bed at the same time each night.  Get out of bed if you are still awake after 15 minutes of trying to sleep. Keep the lights down, but try reading or doing a quiet activity. When you feel sleepy, go back to bed.  Make sure that you drive carefully. Avoid driving if you feel very sleepy.  Keep all follow-up appointments as directed by your health care provider. This is important. Contact a health care provider if:  You are tired throughout the day or have trouble in your daily routine due to sleepiness.  You continue to have sleep problems or your sleep problems get worse. Get help right away if:  You have serious thoughts about hurting yourself or someone else. This information is not intended to replace advice given to you by your health care provider. Make sure you discuss any questions you have with your health care provider. Document Released: 12/02/2000 Document Revised: 05/06/2016 Document Reviewed: 09/05/2014 Elsevier Interactive Patient Education  2018 [employer].  Generalized Anxiety Disorder, Adult Generalized anxiety disorder (GAD) is a mental health disorder. People with this condition constantly worry about everyday events. Unlike normal anxiety, worry related to GAD is not triggered by a specific event. These worries also do not fade or get better with time. GAD interferes with life functions, including relationships, work, and school. GAD can vary from mild to severe. People with severe GAD can have intense waves of anxiety with physical symptoms (panic attacks). What are the  causes? The exact cause of GAD is not known. What increases the risk? This condition is more likely to develop in:  Women.  People who have a family history of anxiety disorders.  People who are very shy.  People who experience very stressful life events, such as the death of a loved one.  People who have a very stressful family environment.  What are the signs or symptoms? People with GAD often worry excessively about many things in their lives, such as their health and family. They may also be overly concerned about:  Doing well at work.  Being on time.  Natural disasters.  Friendships.  Physical symptoms of GAD include:  Fatigue.  Muscle tension or having muscle twitches.  Trembling or feeling shaky.  Being easily startled.  Feeling like your heart is pounding or racing.  Feeling out of breath or like you cannot take a deep breath.  Having trouble falling asleep or staying asleep.  Sweating.  Nausea, diarrhea, or irritable bowel syndrome (IBS).  Headaches.  Trouble concentrating or remembering facts.  Restlessness.  Irritability.  How is this diagnosed? Your health care provider can diagnose GAD based on your symptoms and medical history. You will also have a physical exam. The health care provider will ask specific questions about your symptoms,  including how severe they are, when they started, and if they come and go. Your health care provider may ask you about your use of alcohol or drugs, including prescription medicines. Your health care provider may refer you to a mental health specialist for further evaluation. Your health care provider will do a thorough examination and may perform additional tests to rule out other possible causes of your symptoms. To be diagnosed with GAD, a person must have anxiety that:  Is out of his or her control.  Affects several different aspects of his or her life, such as work and relationships.  Causes distress  that makes him or her unable to take part in normal activities.  Includes at least three physical symptoms of GAD, such as restlessness, fatigue, trouble concentrating, irritability, muscle tension, or sleep problems.  Before your health care provider can confirm a diagnosis of GAD, these symptoms must be present more days than they are not, and they must last for six months or longer. How is this treated? The following therapies are usually used to treat GAD:  Medicine. Antidepressant medicine is usually prescribed for long-term daily control. Antianxiety medicines may be added in severe cases, especially when panic attacks occur.  Talk therapy (psychotherapy). Certain types of talk therapy can be helpful in treating GAD by providing support, education, and guidance. Options include: ? Cognitive behavioral therapy (CBT). People learn coping skills and techniques to ease their anxiety. They learn to identify unrealistic or negative thoughts and behaviors and to replace them with positive ones. ? Acceptance and commitment therapy (ACT). This treatment teaches people how to be mindful as a way to cope with unwanted thoughts and feelings. ? Biofeedback. This process trains you to manage your body's response (physiological response) through breathing techniques and relaxation methods. You will work with a therapist while machines are used to monitor your physical symptoms.  Stress management techniques. These include yoga, meditation, and exercise.  A mental health specialist can help determine which treatment is best for you. Some people see improvement with one type of therapy. However, other people require a combination of therapies. Follow these instructions at home:  Take over-the-counter and prescription medicines only as told by your health care provider.  Try to maintain a normal routine.  Try to anticipate stressful situations and allow extra time to manage them.  Practice any stress  management or self-calming techniques as taught by your health care provider.  Do not punish yourself for setbacks or for not making progress.  Try to recognize your accomplishments, even if they are small.  Keep all follow-up visits as told by your health care provider. This is important. Contact a health care provider if:  Your symptoms do not get better.  Your symptoms get worse.  You have signs of depression, such as: ? A persistently sad, cranky, or irritable mood. ? Loss of enjoyment in activities that used to bring you joy. ? Change in weight or eating. ? Changes in sleeping habits. ? Avoiding friends or family members. ? Loss of energy for normal tasks. ? Feelings of guilt or worthlessness. Get help right away if:  You have serious thoughts about hurting yourself or others. If you ever feel like you may hurt yourself or others, or have thoughts about taking your own life, get help right away. You can go to your nearest emergency department or call:  Your local emergency services (911 in the [place]  A suicide crisis helpline, such as the National Suicide Prevention  Lifeline at 646-531-8961. This is open 24 hours a day.  Summary  Generalized anxiety disorder (GAD) is a mental health disorder that involves worry that is not triggered by a specific event.  People with GAD often worry excessively about many things in their lives, such as their health and family.  GAD may cause physical symptoms such as restlessness, trouble concentrating, sleep problems, frequent sweating, nausea, diarrhea, headaches, and trembling or muscle twitching.  A mental health specialist can help determine which treatment is best for you. Some people see improvement with one type of therapy. However, other people require a combination of therapies. This information is not intended to replace advice given to you by your health care provider. Make sure you discuss any questions you have with your  health care provider. Document Released: 04/01/2013 Document Revised: 10/25/2016 Document Reviewed: 10/25/2016 Elsevier Interactive Patient Education  2018 [employer].    IF you received an x-ray today, you will receive an invoice from [facility]. Please contact [facility] at [phone] with questions or concerns regarding your invoice.   IF you received labwork today, you will receive an invoice from [facility]. Please contact [facility] at [phone] with questions or concerns regarding your invoice.   Our billing staff will not be able to assist you with questions regarding bills from these companies.  You will be contacted with the lab results as soon as they are available. The fastest way to get your results is to activate your My Chart account. Instructions are located on the last page of this paperwork. If you have not heard from [place] regarding the results in 2 weeks, please contact this office.

## 2018-06-11 NOTE — Telephone Encounter (Signed)
06/11/2018 - IF THIS PATIENT SHOULD CALL BACK FOR ANY REASON PLEASE GET HER UPDATED TELEPHONE NUMBER. THE ONE ON HER REGISTRATION IS SAYING THAT IT IS NOT A VALID NUMBER. PLEASE HAVE HER TO SPEAK TO MARY C. AT [facility] [place]. SHE MAY ALSO CALL TO SPEAK TO [facility] SO THAT SHE CAN PRINT HER AN ITEMIZED BILL. [place]

## 2018-06-11 NOTE — Progress Notes (Signed)
Subjective:    Patient ID: Rachel Weber, female    DOB: 07-11-1961, 57 y.o.   MRN: [id number]  HPI Rachel Weber is a 57 y.o. female Presents today for: Chief Complaint  Patient presents with  . Medication Refill    ativan, abluterol, and a sleep medication. due to going back to [place]    Here for follow-up and medication refills.  Generalized anxiety disorder: Initially seen in February.  They have been using Ativan 1 mg every 1 to 2 days.  Was having some situational stressors with moving from [place] and with her mother's health.  Had titrated her Ativan down from twice per day to once per day over the prior few months.  We had also discussed option of hydroxyzine -25 mg tablets to help with either anxiety or sleep.  We had discussed meeting with a therapist, but that was cost prohibitive and she felt like she had a good support system.  Plans on moving back to [place] in 8 days.  Not sure how long she will be there. May be meeting with immigration between now and August 3rd to decide on timing of return, may be another 3 months. Will be going to [place] to work and live with family, does not have physician there and is requesting 3 month prescription of Ativan as otherwise would need to be seen in emergency room.  Has tried taking 1 Ativan every other day with 2 hydroxyzine on days not taking Ativan. Uncle passed 2 weeks ago and unable to go to his funeral.  Has taken 1 ativan per day for past 2 weeks.    Terrified of side effects of SSRI's - will not take those meds.   Also requesting refill of temazepam 15mg  - takes 2 -3 nights per week. Presents bottle from 09/04/17 for #120, with 1 left.   Last hydroxyzine prescription 05/09/2018 for #30.  Last Ativan prescription for #60 on 05/29/2018.  We did discuss potential SSRI if frequent benzodiazepine dosing dosing or need.  2 of the hydroxyzine has been helpful with anxiety, but has not tried those to help with sleep.   Has been exercising 4-5 days per  week.   Bronchospasm: See prior visits, mild intermittent symptoms at that time with occasional albuterol.  Discussed potential inhaled corticosteroid if frequent use.  Also discussed symptomatic care and avoidance measures for seasonal allergies. Can go a week without the albuterol, some days may need it more than once. Typically uses once or twice per week.    CSIR reviewed - consistent with ordered meds, no additional Rx's.   Patient Active Problem List   Diagnosis Date Noted  . Generalized anxiety disorder 02/17/2018  . Wheezing 02/17/2018  . Seasonal allergies 02/17/2018   Past Medical History:  Diagnosis Date  . Allergy   . Anxiety   . Cataract   . TMJ arthritis    Past Surgical History:  Procedure Laterality Date  . APPENDECTOMY    . BREAST SURGERY    . COSMETIC SURGERY     No Known Allergies Prior to Admission medications   Medication Sig Start Date End Date Taking? Authorizing Provider  albuterol (PROVENTIL HFA;VENTOLIN HFA) 108 (90 Base) MCG/ACT inhaler Inhale 1-2 puffs into the lungs every 4 (four) hours as needed for wheezing or shortness of breath. 02/15/18  Yes [name], MD  hydrOXYzine (ATARAX/VISTARIL) 25 MG tablet TAKE 0.5-1 TABLETS (12.5-25 MG TOTAL) BY MOUTH 3 (THREE) TIMES DAILY AS NEEDED FOR ANXIETY. 05/09/18  Yes  [name], MD  LORazepam (ATIVAN) 1 MG tablet TAKE 1 TABLET (1 MG TOTAL) BY MOUTH 2 (TWO) TIMES DAILY AS NEEDED FOR ANXIETY. 05/29/18  Yes [name], MD  meloxicam (MOBIC) 7.5 MG tablet TAKE 1 TABLET BY MOUTH EVERY DAY 05/09/18  Yes [name], MD   Social History   Socioeconomic History  . Marital status: Married    Spouse name: Not on file  . Number of children: 1  . Years of education: Not on file  . Highest education level: Not on file  Occupational History  . Not on file  Social Needs  . Financial resource strain: Not on file  . Food insecurity:    Worry: Not on file    Inability: Not on file  .  Transportation needs:    Medical: Not on file    Non-medical: Not on file  Tobacco Use  . Smoking status: Current Every Day Smoker    Packs/day: 0.25    Types: Cigarettes  . Smokeless tobacco: Never Used  Substance and Sexual Activity  . Alcohol use: Yes    Comment: occ  . Drug use: Not Currently  . Sexual activity: Yes  Lifestyle  . Physical activity:    Days per week: Not on file    Minutes per session: Not on file  . Stress: Not on file  Relationships  . Social connections:    Talks on phone: Not on file    Gets together: Not on file    Attends religious service: Not on file    Active member of club or organization: Not on file    Attends meetings of clubs or organizations: Not on file    Relationship status: Not on file  . Intimate partner violence:    Fear of current or ex partner: Not on file    Emotionally abused: Not on file    Physically abused: Not on file    Forced sexual activity: Not on file  Other Topics Concern  . Not on file  Social History Narrative  . Not on file    Review of Systems     Objective:   Physical Exam  Constitutional: She is oriented to person, place, and time. She appears well-developed and well-nourished. No distress.  HENT:  Head: Normocephalic and atraumatic.  Eyes: Pupils are equal, round, and reactive to light. Conjunctivae and EOM are normal.  Neck: Carotid bruit is not present.  Cardiovascular: Normal rate, regular rhythm, normal heart sounds and intact distal pulses.  Pulmonary/Chest: Effort normal and breath sounds normal.  Abdominal: Soft. She exhibits no pulsatile midline mass. There is no tenderness.  Neurological: She is alert and oriented to person, place, and time.  Skin: Skin is warm and dry.  Psychiatric: She has a normal mood and affect. Her speech is normal and behavior is normal. Thought content normal.  Vitals reviewed.   Vitals:   06/11/18 1421  BP: 118/78  Pulse: 90  Temp: 97.7 F (36.5 C)  TempSrc:  Oral  SpO2: 94%  Weight: 172 lb (78 kg)  Height: 5\' 9"  (1.753 m)       Assessment & Plan:    Rachel Weber is a 57 y.o. female Generalized anxiety disorder - Plan: hydrOXYzine (ATARAX/VISTARIL) 25 MG tablet  -Had been improving with ability to taper back on Ativan but some recent increased stressors as above.  Did have some relief with use of hydroxyzine.  Will be traveling to [place] in the next 8  days, and will have some difficulty seeking care initially other than through emergency room reportedly.  -Reviewed controlled substance reporting system and consistent with prescriptions provided here.  Will provide additional #60 of Ativan but discussed trying hydroxyzine in place of Ativan and that was also refilled.  Would still consider SSRI if daily use of benzodiazepine, but that was deferred at this point.  She plans on possibly returning here in the next few months and we can discuss that regimen upon return if still requiring daily medication.  -I did refill the hydroxyzine during her visit, but called after the visit to discuss Ativan refill.  Unfortunately her home phone number does not work.  I advised her emergency contact Collier Salina to have her call us to review this plan.  Let me know if there are questions.  Wheezing - Plan: albuterol (PROVENTIL HFA;VENTOLIN HFA) 108 (90 Base) MCG/ACT inhaler  -Albuterol refilled for as needed use.  Still appears to be overall stable with as needed albuterol only, but to consider inhaled corticosteroid if more frequent need.   Meds ordered this encounter  Medications  . hydrOXYzine (ATARAX/VISTARIL) 25 MG tablet    Sig: Take 1-2 tablets (25-50 mg total) by mouth every 8 (eight) hours as needed for anxiety (or sleep).    Dispense:  90 tablet    Refill:  0  . albuterol (PROVENTIL HFA;VENTOLIN HFA) 108 (90 Base) MCG/ACT inhaler    Sig: Inhale 1-2 puffs into the lungs every 4 (four) hours as needed for wheezing or shortness of breath.    Dispense:  1  Inhaler    Refill:  0    Patient Instructions   Ok to continue hydroxyzine for anxiety, but can also take that at bedtime to help with sleep.   I will need to check into the Ativan and how that can be written with you leaving the country.   Albuterol was refilled if needed. If you require that med more often, I would recommend a different class of meds that you can use daily to lessen the need for albuterol.    Return to the clinic or go to the nearest emergency room if any of your symptoms worsen or new symptoms occur.  Insomnia Insomnia is a sleep disorder that makes it difficult to fall asleep or to stay asleep. Insomnia can cause tiredness (fatigue), low energy, difficulty concentrating, mood swings, and poor performance at work or school. There are three different ways to classify insomnia:  Difficulty falling asleep.  Difficulty staying asleep.  Waking up too early in the morning.  Any type of insomnia can be long-term (chronic) or short-term (acute). Both are common. Short-term insomnia usually lasts for three months or less. Chronic insomnia occurs at least three times a week for longer than three months. What are the causes? Insomnia may be caused by another condition, situation, or substance, such as:  Anxiety.  Certain medicines.  Gastroesophageal reflux disease (GERD) or other gastrointestinal conditions.  Asthma or other breathing conditions.  Restless legs syndrome, sleep apnea, or other sleep disorders.  Chronic pain.  Menopause. This may include hot flashes.  Stroke.  Abuse of alcohol, tobacco, or illegal drugs.  Depression.  Caffeine.  Neurological disorders, such as Alzheimer disease.  An overactive thyroid (hyperthyroidism).  The cause of insomnia may not be known. What increases the risk? Risk factors for insomnia include:  Gender. Women are more commonly affected than men.  Age. Insomnia is more common as you get older.  Stress. This  may involve your professional or personal life.  Income. Insomnia is more common in people with lower income.  Lack of exercise.  Irregular work schedule or night shifts.  Traveling between different time zones.  What are the signs or symptoms? If you have insomnia, trouble falling asleep or trouble staying asleep is the main symptom. This may lead to other symptoms, such as:  Feeling fatigued.  Feeling nervous about going to sleep.  Not feeling rested in the morning.  Having trouble concentrating.  Feeling irritable, anxious, or depressed.  How is this treated? Treatment for insomnia depends on the cause. If your insomnia is caused by an underlying condition, treatment will focus on addressing the condition. Treatment may also include:  Medicines to help you sleep.  Counseling or therapy.  Lifestyle adjustments.  Follow these instructions at home:  Take medicines only as directed by your health care provider.  Keep regular sleeping and waking hours. Avoid naps.  Keep a sleep diary to help you and your health care provider figure out what could be causing your insomnia. Include: ? When you sleep. ? When you wake up during the night. ? How well you sleep. ? How rested you feel the next day. ? Any side effects of medicines you are taking. ? What you eat and drink.  Make your bedroom a comfortable place where it is easy to fall asleep: ? Put up shades or special blackout curtains to block light from outside. ? Use a white noise machine to block noise. ? Keep the temperature cool.  Exercise regularly as directed by your health care provider. Avoid exercising right before bedtime.  Use relaxation techniques to manage stress. Ask your health care provider to suggest some techniques that may work well for you. These may include: ? Breathing exercises. ? Routines to release muscle tension. ? Visualizing peaceful scenes.  Cut back on alcohol, caffeinated beverages,  and cigarettes, especially close to bedtime. These can disrupt your sleep.  Do not overeat or eat spicy foods right before bedtime. This can lead to digestive discomfort that can make it hard for you to sleep.  Limit screen use before bedtime. This includes: ? Watching TV. ? Using your smartphone, tablet, and computer.  Stick to a routine. This can help you fall asleep faster. Try to do a quiet activity, brush your teeth, and go to bed at the same time each night.  Get out of bed if you are still awake after 15 minutes of trying to sleep. Keep the lights down, but try reading or doing a quiet activity. When you feel sleepy, go back to bed.  Make sure that you drive carefully. Avoid driving if you feel very sleepy.  Keep all follow-up appointments as directed by your health care provider. This is important. Contact a health care provider if:  You are tired throughout the day or have trouble in your daily routine due to sleepiness.  You continue to have sleep problems or your sleep problems get worse. Get help right away if:  You have serious thoughts about hurting yourself or someone else. This information is not intended to replace advice given to you by your health care provider. Make sure you discuss any questions you have with your health care provider. Document Released: 12/02/2000 Document Revised: 05/06/2016 Document Reviewed: 09/05/2014 Elsevier Interactive Patient Education  2018 [employer].  Generalized Anxiety Disorder, Adult Generalized anxiety disorder (GAD) is a mental health disorder. People with this condition constantly worry about everyday events.  Unlike normal anxiety, worry related to GAD is not triggered by a specific event. These worries also do not fade or get better with time. GAD interferes with life functions, including relationships, work, and school. GAD can vary from mild to severe. People with severe GAD can have intense waves of anxiety with physical  symptoms (panic attacks). What are the causes? The exact cause of GAD is not known. What increases the risk? This condition is more likely to develop in:  Women.  People who have a family history of anxiety disorders.  People who are very shy.  People who experience very stressful life events, such as the death of a loved one.  People who have a very stressful family environment.  What are the signs or symptoms? People with GAD often worry excessively about many things in their lives, such as their health and family. They may also be overly concerned about:  Doing well at work.  Being on time.  Natural disasters.  Friendships.  Physical symptoms of GAD include:  Fatigue.  Muscle tension or having muscle twitches.  Trembling or feeling shaky.  Being easily startled.  Feeling like your heart is pounding or racing.  Feeling out of breath or like you cannot take a deep breath.  Having trouble falling asleep or staying asleep.  Sweating.  Nausea, diarrhea, or irritable bowel syndrome (IBS).  Headaches.  Trouble concentrating or remembering facts.  Restlessness.  Irritability.  How is this diagnosed? Your health care provider can diagnose GAD based on your symptoms and medical history. You will also have a physical exam. The health care provider will ask specific questions about your symptoms, including how severe they are, when they started, and if they come and go. Your health care provider may ask you about your use of alcohol or drugs, including prescription medicines. Your health care provider may refer you to a mental health specialist for further evaluation. Your health care provider will do a thorough examination and may perform additional tests to rule out other possible causes of your symptoms. To be diagnosed with GAD, a person must have anxiety that:  Is out of his or her control.  Affects several different aspects of his or her life, such as work  and relationships.  Causes distress that makes him or her unable to take part in normal activities.  Includes at least three physical symptoms of GAD, such as restlessness, fatigue, trouble concentrating, irritability, muscle tension, or sleep problems.  Before your health care provider can confirm a diagnosis of GAD, these symptoms must be present more days than they are not, and they must last for six months or longer. How is this treated? The following therapies are usually used to treat GAD:  Medicine. Antidepressant medicine is usually prescribed for long-term daily control. Antianxiety medicines may be added in severe cases, especially when panic attacks occur.  Talk therapy (psychotherapy). Certain types of talk therapy can be helpful in treating GAD by providing support, education, and guidance. Options include: ? Cognitive behavioral therapy (CBT). People learn coping skills and techniques to ease their anxiety. They learn to identify unrealistic or negative thoughts and behaviors and to replace them with positive ones. ? Acceptance and commitment therapy (ACT). This treatment teaches people how to be mindful as a way to cope with unwanted thoughts and feelings. ? Biofeedback. This process trains you to manage your body's response (physiological response) through breathing techniques and relaxation methods. You will work with a therapist while machines are  used to monitor your physical symptoms.  Stress management techniques. These include yoga, meditation, and exercise.  A mental health specialist can help determine which treatment is best for you. Some people see improvement with one type of therapy. However, other people require a combination of therapies. Follow these instructions at home:  Take over-the-counter and prescription medicines only as told by your health care provider.  Try to maintain a normal routine.  Try to anticipate stressful situations and allow extra time to  manage them.  Practice any stress management or self-calming techniques as taught by your health care provider.  Do not punish yourself for setbacks or for not making progress.  Try to recognize your accomplishments, even if they are small.  Keep all follow-up visits as told by your health care provider. This is important. Contact a health care provider if:  Your symptoms do not get better.  Your symptoms get worse.  You have signs of depression, such as: ? A persistently sad, cranky, or irritable mood. ? Loss of enjoyment in activities that used to bring you joy. ? Change in weight or eating. ? Changes in sleeping habits. ? Avoiding friends or family members. ? Loss of energy for normal tasks. ? Feelings of guilt or worthlessness. Get help right away if:  You have serious thoughts about hurting yourself or others. If you ever feel like you may hurt yourself or others, or have thoughts about taking your own life, get help right away. You can go to your nearest emergency department or call:  Your local emergency services (911 in the [place]  A suicide crisis helpline, such as the [facility] at 956-649-8495. This is open 24 hours a day.  Summary  Generalized anxiety disorder (GAD) is a mental health disorder that involves worry that is not triggered by a specific event.  People with GAD often worry excessively about many things in their lives, such as their health and family.  GAD may cause physical symptoms such as restlessness, trouble concentrating, sleep problems, frequent sweating, nausea, diarrhea, headaches, and trembling or muscle twitching.  A mental health specialist can help determine which treatment is best for you. Some people see improvement with one type of therapy. However, other people require a combination of therapies. This information is not intended to replace advice given to you by your health care provider. Make sure you discuss  any questions you have with your health care provider. Document Released: 04/01/2013 Document Revised: 10/25/2016 Document Reviewed: 10/25/2016 Elsevier Interactive Patient Education  2018 [employer].    IF you received an x-ray today, you will receive an invoice from [facility]. Please contact [facility] at [phone] with questions or concerns regarding your invoice.   IF you received labwork today, you will receive an invoice from [facility]. Please contact [facility] at [phone] with questions or concerns regarding your invoice.   Our billing staff will not be able to assist you with questions regarding bills from these companies.  You will be contacted with the lab results as soon as they are available. The fastest way to get your results is to activate your My Chart account. Instructions are located on the last page of this paperwork. If you have not heard from [place] regarding the results in 2 weeks, please contact this office.

## 2018-06-14 ENCOUNTER — Other Ambulatory Visit: Payer: Self-pay | Admitting: Family Medicine

## 2018-06-14 DIAGNOSIS — M2550 Pain in unspecified joint: Secondary | ICD-10-CM

## 2018-06-14 DIAGNOSIS — F411 Generalized anxiety disorder: Secondary | ICD-10-CM

## 2018-06-14 NOTE — Telephone Encounter (Signed)
Okay to refill? Pt was last seen 06/11/18 and there was no mention of this medication.

## 2018-06-16 ENCOUNTER — Encounter: Payer: Self-pay | Admitting: Family Medicine

## 2018-06-16 MED ORDER — LORAZEPAM 1 MG PO TABS: 1.0000 mg | ORAL_TABLET | Freq: Two times a day (BID) | ORAL | 0 refills | Status: DC | PRN

## 2018-06-18 ENCOUNTER — Other Ambulatory Visit: Payer: Self-pay | Admitting: *Deleted

## 2018-06-18 DIAGNOSIS — R062 Wheezing: Secondary | ICD-10-CM

## 2018-06-18 MED ORDER — ALBUTEROL SULFATE HFA 108 (90 BASE) MCG/ACT IN AERS: 1.0000 | INHALATION_SPRAY | RESPIRATORY_TRACT | 0 refills | Status: DC | PRN

## 2018-06-18 NOTE — Telephone Encounter (Signed)
Pt called back and confirmed the number is [phone].  Because it is in [place] and long distance, you must enter your long distance code after you enter the number.

## 2018-07-11 ENCOUNTER — Other Ambulatory Visit: Payer: Self-pay | Admitting: Family Medicine

## 2018-07-11 DIAGNOSIS — F411 Generalized anxiety disorder: Secondary | ICD-10-CM

## 2018-07-11 DIAGNOSIS — M2550 Pain in unspecified joint: Secondary | ICD-10-CM

## 2018-07-13 NOTE — Telephone Encounter (Signed)
Rx refill request: Hydroxyzine 25 mg      Last filled: 06/11/18                             Mobic 7.5 mg                Last filled: 06/15/18  LOV: 06/11/18  PCP: [place]: verified

## 2018-07-15 ENCOUNTER — Other Ambulatory Visit: Payer: Self-pay | Admitting: Family Medicine

## 2018-07-15 DIAGNOSIS — R062 Wheezing: Secondary | ICD-10-CM

## 2018-07-16 NOTE — Telephone Encounter (Signed)
Albuterol refill Last Refill:06/18/18  # 1 inhaler Last OV: 06/11/18 PCP: Dr Carlota Raspberry Pharmacy:[facility]

## 2018-07-17 NOTE — Telephone Encounter (Signed)
Please clarify as my understanding last visit was that she was moving to [place].

## 2018-07-18 NOTE — Telephone Encounter (Signed)
Contacted pt and she states "Dr Nyoka Cowden has been giving her a hard time" and she "does not want the prescriptions"; will route to office for notification.

## 2018-07-18 NOTE — Telephone Encounter (Signed)
Please see last note - I am not against refilling meds, but at last OV on 06/11/18 she had planned on moving back to [place] in 8 days. Did that not occur? My understanding at that visit was that she was taking 2 hydroxyzine every other day. #90 hydroxyzine Rx on 6/24.  If she has required that more frequently, I do not mind refilling it, just didn't know if travel plan had changed.   Regarding Mobic, that was prescribed in March with plan to return to discuss areas of pain further and plan.  That unfortunately was not discussed at her visit in June.  I am ok with one more refill, but due to potential long term risks of that medication, will need to discuss further in office in order to continue to prescribe.   Let me know if she is still in [place] and about hydroxyzine need to determine refill plan for both meds.  Thanks.

## 2020-01-13 ENCOUNTER — Other Ambulatory Visit: Payer: Self-pay | Admitting: Family Medicine

## 2020-01-13 ENCOUNTER — Other Ambulatory Visit: Payer: Self-pay | Admitting: Nurse Practitioner

## 2020-01-13 DIAGNOSIS — Z1231 Encounter for screening mammogram for malignant neoplasm of breast: Secondary | ICD-10-CM

## 2020-01-13 DIAGNOSIS — M7918 Myalgia, other site: Secondary | ICD-10-CM

## 2020-01-14 ENCOUNTER — Ambulatory Visit
Admission: RE | Admit: 2020-01-14 | Discharge: 2020-01-14 | Disposition: A | Discharge status: Home / Self Care | Payer: BC Managed Care – PPO | Source: Ambulatory Visit | Attending: Family Medicine | Admitting: Family Medicine

## 2020-01-14 ENCOUNTER — Other Ambulatory Visit: Payer: Self-pay

## 2020-01-14 DIAGNOSIS — M7918 Myalgia, other site: Secondary | ICD-10-CM

## 2020-01-17 ENCOUNTER — Other Ambulatory Visit: Payer: Self-pay | Admitting: Family Medicine

## 2020-01-20 ENCOUNTER — Ambulatory Visit: Payer: BC Managed Care – PPO | Admitting: Podiatry

## 2020-01-28 DIAGNOSIS — M950 Acquired deformity of nose: Secondary | ICD-10-CM | POA: Insufficient documentation

## 2020-02-05 ENCOUNTER — Other Ambulatory Visit: Payer: Self-pay | Admitting: Podiatry

## 2020-02-05 ENCOUNTER — Ambulatory Visit (INDEPENDENT_AMBULATORY_CARE_PROVIDER_SITE_OTHER): Payer: BC Managed Care – PPO

## 2020-02-05 ENCOUNTER — Ambulatory Visit: Payer: BC Managed Care – PPO | Admitting: Podiatry

## 2020-02-05 ENCOUNTER — Other Ambulatory Visit: Payer: Self-pay

## 2020-02-05 ENCOUNTER — Encounter: Payer: Self-pay | Admitting: Podiatry

## 2020-02-05 VITALS — Temp 97.5°F

## 2020-02-05 DIAGNOSIS — M779 Enthesopathy, unspecified: Secondary | ICD-10-CM | POA: Diagnosis not present

## 2020-02-05 DIAGNOSIS — M79672 Pain in left foot: Secondary | ICD-10-CM

## 2020-02-05 DIAGNOSIS — L600 Ingrowing nail: Secondary | ICD-10-CM

## 2020-02-05 MED ORDER — CORTISPORIN-TC 3.3-3-10-0.5 MG/ML OT SUSP: OTIC | 0 refills | Status: DC

## 2020-02-05 NOTE — Progress Notes (Signed)
   Subjective:    Patient ID: Rachel Weber, female    DOB: 09-18-1961, 59 y.o.   MRN: [id number]  HPI    Review of Systems  All other systems reviewed and are negative.      Objective:   Physical Exam        Assessment & Plan:

## 2020-02-05 NOTE — Progress Notes (Signed)
Subjective:   Patient ID: Rachel Weber, female   DOB: 59 y.o.   MRN: [id number]   HPI Patient presents stating she has had chronic ingrown toenail right over left and states that she has had a lot of pain in the outside of her left foot and did have a fracture of that when she was young and over the last few years it is become more bothersome but does not appear to be associated with that.  Patient smokes 1/4 pack/day and likes to be active   Review of Systems  All other systems reviewed and are negative.       Objective:  Physical Exam Vitals and nursing note reviewed.  Constitutional:      Appearance: She is well-developed.  Pulmonary:     Effort: Pulmonary effort is normal.  Musculoskeletal:        General: Normal range of motion.  Skin:    General: Skin is warm.  Neurological:     Mental Status: She is alert.     Neurovascular status intact muscle strength found to be adequate range of motion within normal limits.  Patient is found to have incurvated nail bed right over left medial border that is painful when pressed with no active drainage noted and is noted to have inflammation pain fifth metatarsal base left that is sore makes it hard to walk.  Patient has good digital perfusion well oriented x3     Assessment:  Ingrown toenail deformity right hallux medial border with pain along with inflammation of the peroneal tendon base of fifth metatarsal which may be due to previous fracture or may be just coincidental     Plan:  H&P all conditions reviewed and today were to focus on the ingrown toenail right and the left tendon with the possibility that nail may need to be repaired future.  I explained nail procedure allowed her to read consent form and patient wants procedure and I infiltrated the right hallux 60 mg like Marcaine mixture sterile prep applied and using sterile instrumentation remove the medial border exposed matrix and applied phenol 3 applications 30 seconds  followed by alcohol lavage and sterile dressing.  For the left I did sterile prep and injected the insertion peroneal 3 mg Dexasone Kenalog 5 mg Xylocaine base of fifth metatarsal and applied fascial brace to lift up the left side of the foot and also discussed the possibility for ingrown toenail correction left.  Reappoint 2 weeks to reevaluate and see how everything has done

## 2020-02-05 NOTE — Patient Instructions (Signed)

## 2020-02-06 ENCOUNTER — Ambulatory Visit: Payer: BC Managed Care – PPO | Admitting: Podiatry

## 2020-02-21 ENCOUNTER — Ambulatory Visit: Payer: Self-pay

## 2021-03-07 IMAGING — MR MR PELVIS W/O CM
4 of 5 series · 29 of 48 positions shown · non-contrast
Comparison: None.

CLINICAL DATA: Status post fall on ice. Left buttock pain since
11/02/2019.

EXAM:
MRI PELVIS WITHOUT CONTRAST
TECHNIQUE: Multiplanar multisequence MR imaging of the pelvis was performed. No
intravenous contrast was administered.

[Series 3: T2 fat-sat · sagittal · 4.0mm · 0.47mm/px · 8 of 60 slices shown]
[im 1/60]
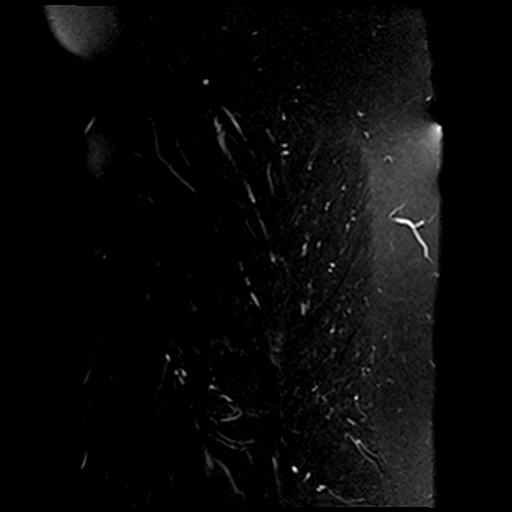
[im 7/60]
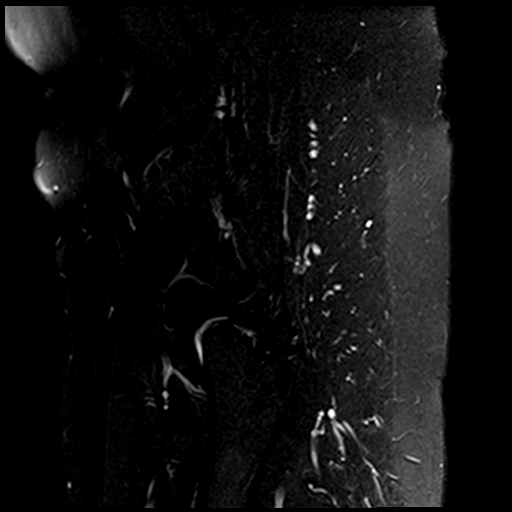
[im 20/60]
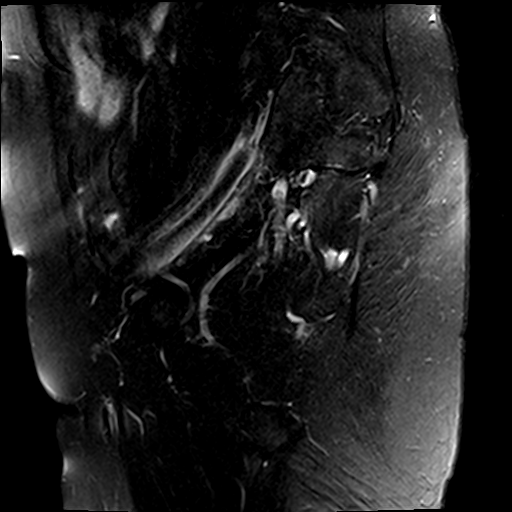
[im 27/60]
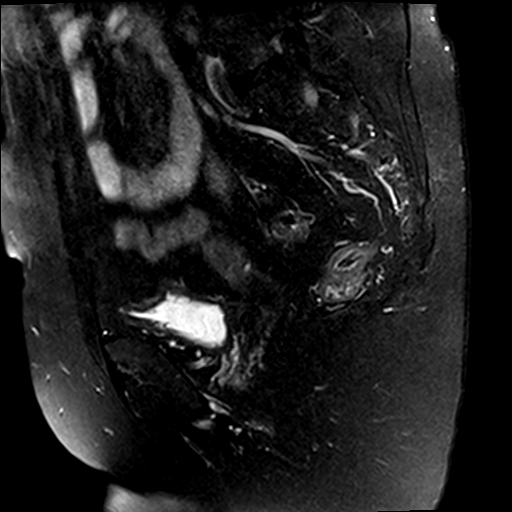
[im 33/60]
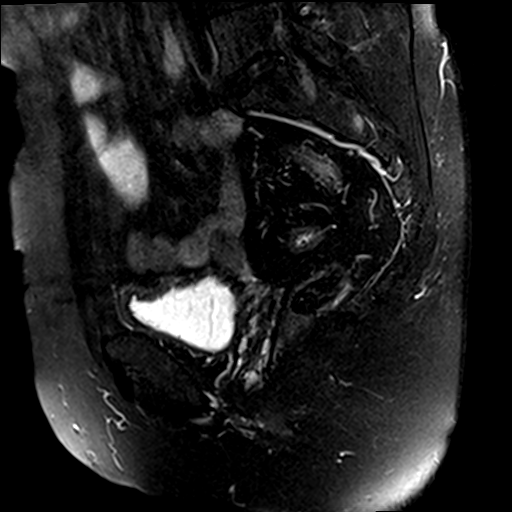
[im 40/60]
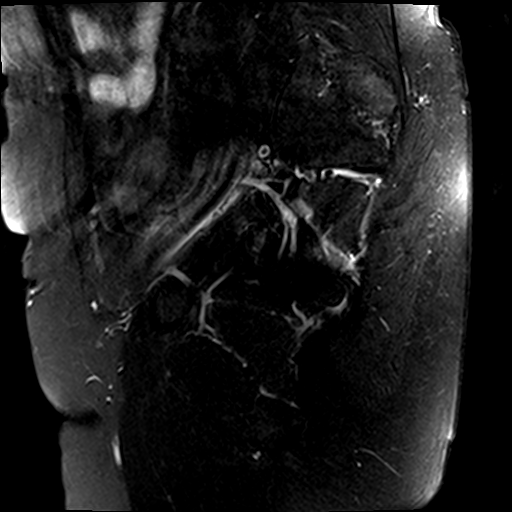
[im 53/60]
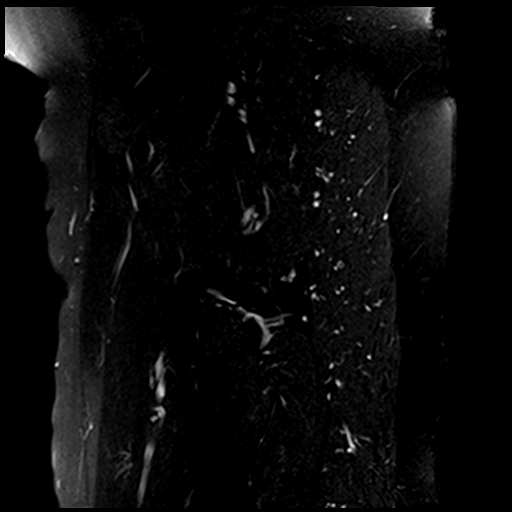
[im 60/60]
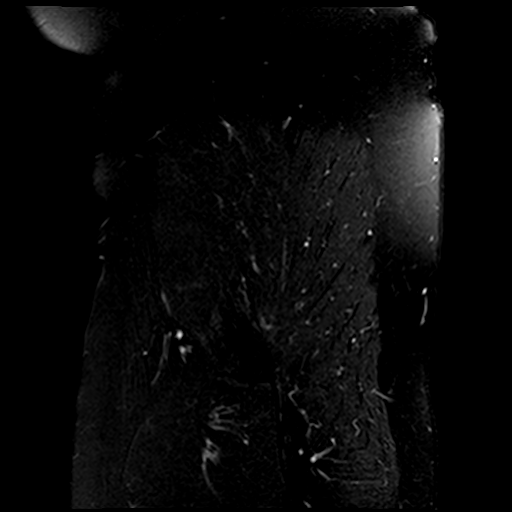

[Series 4: T1 · coronal · 4.0mm · 1.56mm/px · 7 of 40 slices shown (1 of 2)]
[im 1/40]
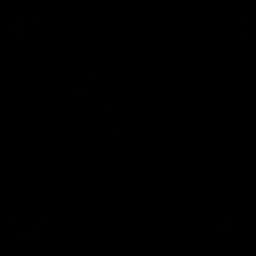
[im 7/40]
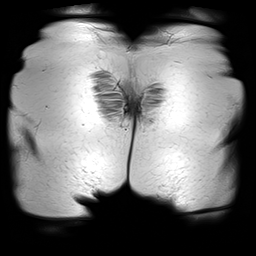
[im 14/40]
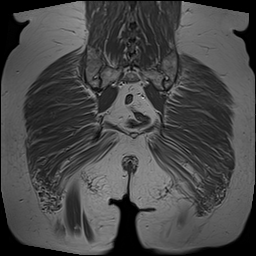
[im 20/40]
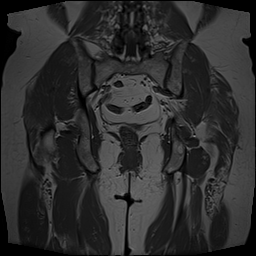
[im 27/40]
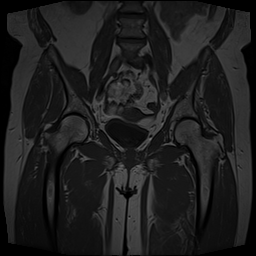
[im 33/40]
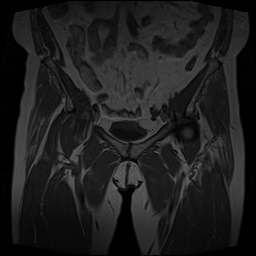
[im 40/40]
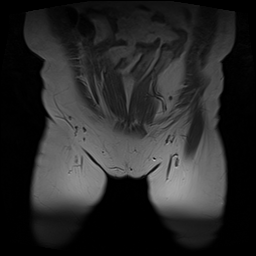

[Series 5: STIR · coronal · 4.0mm · 1.56mm/px · 7 of 38 slices shown]
[im 1/38]
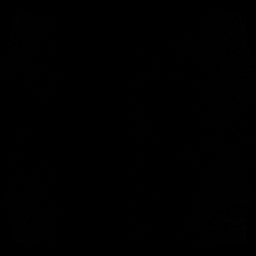
[im 7/38]
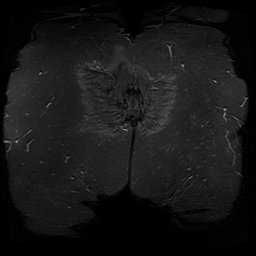
[im 13/38]
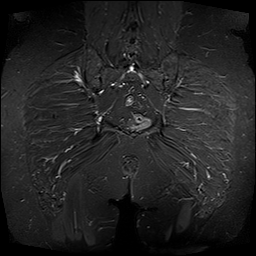
[im 19/38]
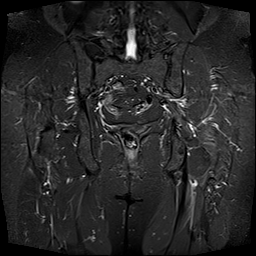
[im 25/38]
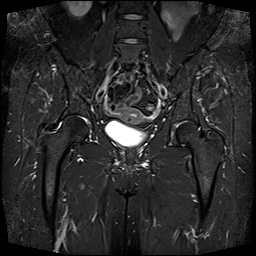
[im 31/38]
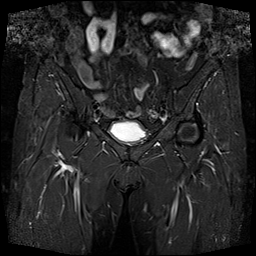
[im 38/38]
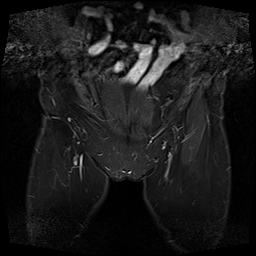

[Series 6: T1 · axial · 4.0mm · 0.68mm/px · z∈[-189,+121]mm · 7 of 70 slices shown (2 of 2)]
[im 1/70]
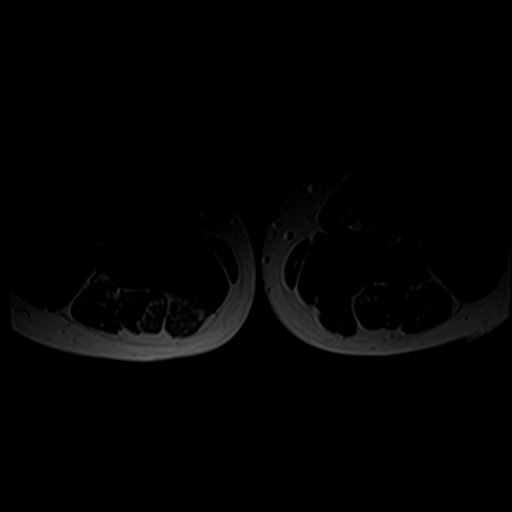
[im 13/70]
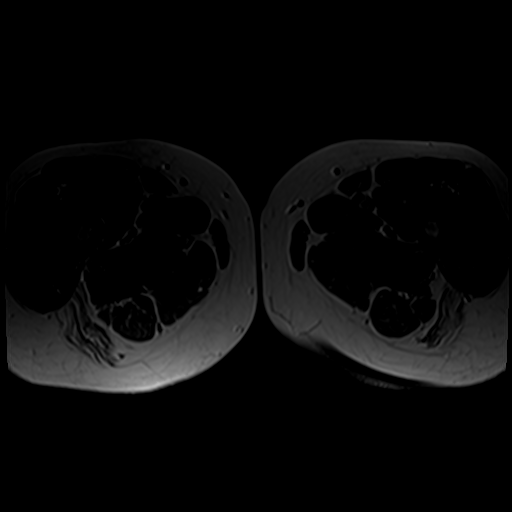
[im 19/70]
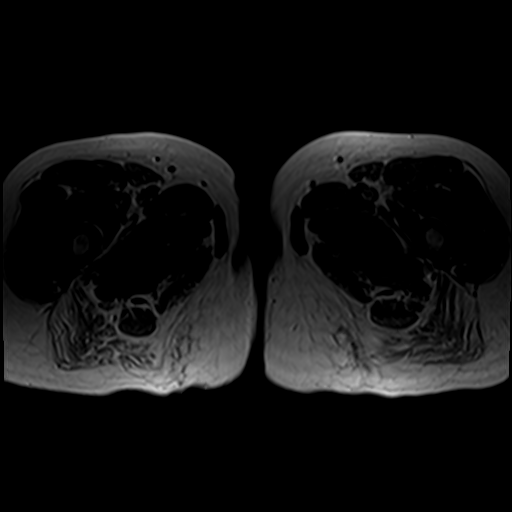
[im 32/70]
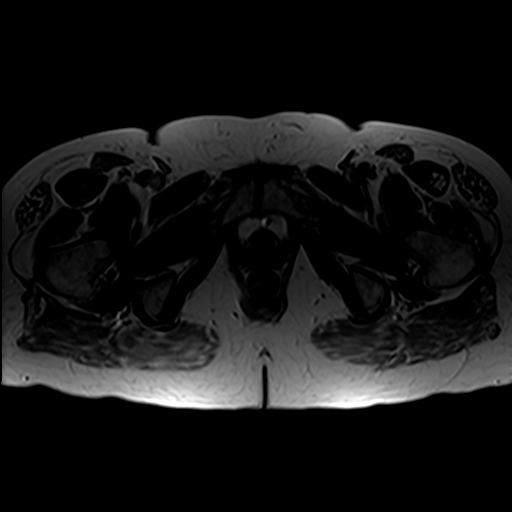
[im 38/70]
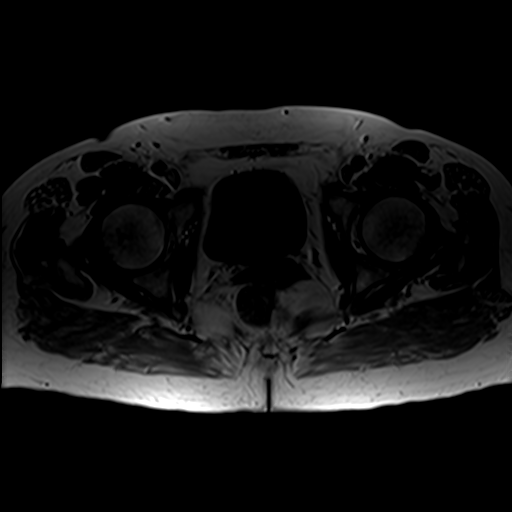
[im 51/70]
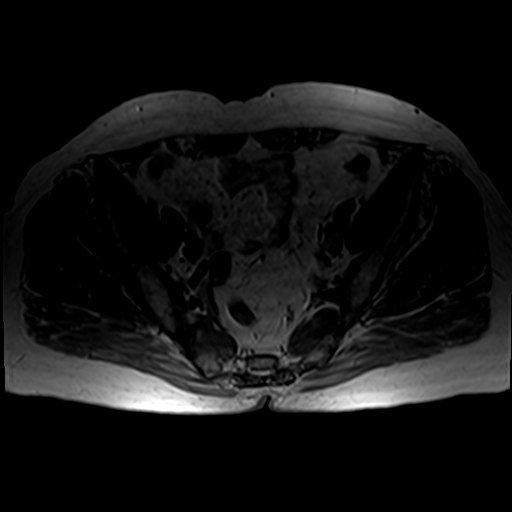
[im 63/70]
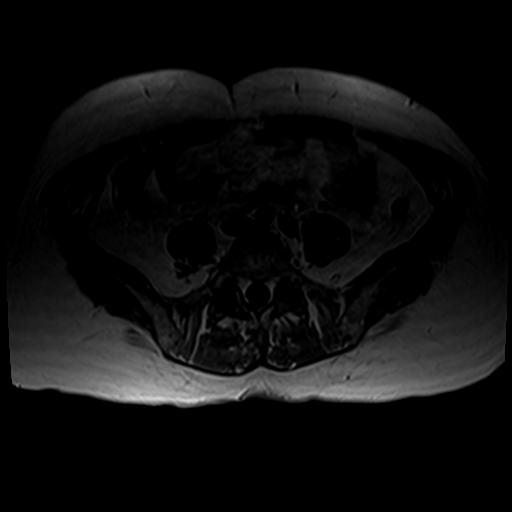

[29 of 48 positions shown; findings below may reference images not displayed]

FINDINGS: Bones: No hip fracture, dislocation or avascular necrosis. No
periosteal reaction or bone destruction. No aggressive osseous
lesion.

Normal sacrum and sacroiliac joints. No SI joint widening or erosive
changes. Moderate right facet arthropathy at L5-S1.

Articular cartilage and labrum

Articular cartilage:  No chondral defect.

Labrum: Grossly intact, but evaluation is limited by lack of
intraarticular fluid.

Joint or bursal effusion

Joint effusion:  No hip joint effusion.  No SI joint effusion.

Bursae:  No bursa formation.

Muscles and tendons

Flexors: Normal.

Extensors: Normal.

Abductors: Normal.

Adductors: Normal.

Gluteals: Normal.

Hamstrings: Normal.

Other findings

Miscellaneous: No pelvic free fluid. No fluid collection or
hematoma. No inguinal lymphadenopathy. No inguinal hernia.
IMPRESSION: 1. No acute injury of the pelvis. No focal fluid collection or
hematoma.

## 2021-05-05 ENCOUNTER — Ambulatory Visit: Payer: BC Managed Care – PPO | Admitting: Podiatry

## 2021-05-05 ENCOUNTER — Telehealth: Payer: Self-pay | Admitting: Podiatry

## 2021-05-05 NOTE — Telephone Encounter (Signed)
Pt left message @ 1021 to cancel her appt for today at 315.  I returned call and left message for pt that I did receive the message and I cxled appt.

## 2021-08-30 ENCOUNTER — Ambulatory Visit: Payer: BC Managed Care – PPO | Admitting: Podiatry

## 2021-09-15 ENCOUNTER — Institutional Professional Consult (permissible substitution): Payer: BC Managed Care – PPO | Admitting: Pulmonary Disease

## 2021-09-24 ENCOUNTER — Telehealth: Payer: Self-pay | Admitting: Pulmonary Disease

## 2021-09-24 NOTE — Telephone Encounter (Signed)
Patent is scheduled asthma, cough consult with Dr. Erin Fulling 10/05/21. Patient has no labs in epic.  LM for Patient asking if she has had any recent labs. If Patient has recent labs she can have them faxed to office or bring to [place]. Per office asthma policy, cbc w/ diff and Ige within a year are needed for asthma consult.  If Patient does not have recent labs Patient can have labs drawn before OV.

## 2021-09-29 NOTE — Telephone Encounter (Signed)
ATC patient.  Left message as consult reminder and asked if patient has any recent labs, she can have them faxed over to office, or we can place labs to be completed, before OV. Labs requested are CBC w/ diff and Ige.

## 2021-10-01 NOTE — Telephone Encounter (Signed)
I have left several messages regarding labs.  No call backs. Patient scheduled 10/05/21.

## 2021-10-05 ENCOUNTER — Institutional Professional Consult (permissible substitution): Payer: BC Managed Care – PPO | Admitting: Pulmonary Disease

## 2022-01-19 ENCOUNTER — Ambulatory Visit: Payer: BC Managed Care – PPO | Admitting: Podiatry

## 2022-01-19 DIAGNOSIS — M7750 Other enthesopathy of unspecified foot: Secondary | ICD-10-CM

## 2022-01-26 ENCOUNTER — Encounter: Payer: Self-pay | Admitting: Podiatry

## 2022-01-26 ENCOUNTER — Other Ambulatory Visit: Payer: Self-pay

## 2022-01-26 ENCOUNTER — Ambulatory Visit: Payer: BC Managed Care – PPO | Admitting: Podiatry

## 2022-01-26 DIAGNOSIS — L600 Ingrowing nail: Secondary | ICD-10-CM

## 2022-01-26 DIAGNOSIS — M722 Plantar fascial fibromatosis: Secondary | ICD-10-CM | POA: Diagnosis not present

## 2022-01-26 NOTE — Patient Instructions (Signed)

## 2022-01-26 NOTE — Progress Notes (Signed)
°  Subjective:  Patient ID: Rachel Weber, female    DOB: November 19, 1961,   MRN: [id number]  Chief Complaint  Patient presents with   Ingrown Toenail    Bilateral     61 y.o. female presents for concern of bilateral ingrown toenails. Relates the left one in particular is tender. She had a procedure on the right a year ago and feels like that one has come back. Also relates some left arch pain that has been going on for years after an injury to her left fifth metataral  . Denies any other pedal complaints. Denies n/v/f/c.   Past Medical History:  Diagnosis Date   Allergy    Anxiety    Cataract    TMJ arthritis     Objective:  Physical Exam: Vascular: DP/PT pulses 2/4 bilateral. CFT <3 seconds. Normal hair growth on digits. No edema.  Skin. No lacerations or abrasions bilateral feet. Incurvation of bilateral hallux medial border. No erythema edema or purulence noted.  Musculoskeletal: MMT 5/5 bilateral lower extremities in DF, PF, Inversion and Eversion. Deceased ROM in DF of ankle joint.  Some tenderness noted through the arch on the left foot. No pain at medial calcaneal tubercle.  Neurological: Sensation intact to light touch.   Assessment:   1. Ingrown left greater toenail   2. Plantar fasciitis of left foot      Plan:  Patient was evaluated and treated and all questions answered. Patient requesting removal of ingrown nail today. Procedure below.  Discussed procedure and post procedure care and patient expressed understanding.  Will follow-up in 2 weeks for nail check or sooner if any problems arise.   Also discussed left arch pain and plantar fasciitis. Patient requesting some support to help with the mild pain. Dispensed a plantar fascia brace    Procedure:  Procedure: partial Nail Avulsion of left hallux medial nail border.  Surgeon: Lorenda Peck, DPM  Pre-op Dx: Ingrown toenail without infection Post-op: Same  Place of Surgery: Office exam room.  Indications  for surgery: Painful and ingrown toenail.    The patient is requesting removal of nail with chemical matrixectomy. Risks and complications were discussed with the patient for which they understand and written consent was obtained. Under sterile conditions a total of 3 mL of  1% lidocaine plain was infiltrated in a hallux block fashion. Once anesthetized, the skin was prepped in sterile fashion. A tourniquet was then applied. Next the medial aspect of hallux nail border was then sharply excised making sure to remove the entire offending nail border.  Next phenol was then applied under standard conditions and copiously irrigated. Silvadene was applied. A dry sterile dressing was applied. After application of the dressing the tourniquet was removed and there is found to be an immediate capillary refill time to the digit. The patient tolerated the procedure well without any complications. Post procedure instructions were discussed the patient for which he verbally understood. Follow-up in two weeks for nail check or sooner if any problems are to arise. Discussed signs/symptoms of infection and directed to call the office immediately should any occur or go directly to the emergency room. In the meantime, encouraged to call the office with any questions, concerns, changes symptoms.   Lorenda Peck, DPM

## 2022-01-27 ENCOUNTER — Telehealth: Payer: Self-pay | Admitting: Podiatry

## 2022-01-27 ENCOUNTER — Other Ambulatory Visit: Payer: Self-pay | Admitting: Podiatry

## 2022-01-27 ENCOUNTER — Telehealth: Payer: Self-pay | Admitting: *Deleted

## 2022-01-27 MED ORDER — IBUPROFEN 800 MG PO TABS: 800.0000 mg | ORAL_TABLET | Freq: Three times a day (TID) | ORAL | 0 refills | Status: DC | PRN

## 2022-01-27 NOTE — Telephone Encounter (Signed)
Patient is calling to request a refill of medication -Cortisporin TC sent to pharmacy on file. Please advise.

## 2022-01-27 NOTE — Telephone Encounter (Signed)
Patient called and stated that she had a procedure on her toe yesterday and she needs some pain meds, she has been up all night.  Please advise

## 2022-01-28 ENCOUNTER — Other Ambulatory Visit: Payer: Self-pay | Admitting: Podiatry

## 2022-01-28 MED ORDER — CORTISPORIN-TC 3.3-3-10-0.5 MG/ML OT SUSP: OTIC | 0 refills | Status: DC

## 2022-02-09 ENCOUNTER — Ambulatory Visit (INDEPENDENT_AMBULATORY_CARE_PROVIDER_SITE_OTHER): Payer: BC Managed Care – PPO | Admitting: Podiatry

## 2022-02-09 DIAGNOSIS — Z91199 Patient's noncompliance with other medical treatment and regimen due to unspecified reason: Secondary | ICD-10-CM

## 2022-02-09 NOTE — Progress Notes (Signed)
No show

## 2022-07-22 ENCOUNTER — Other Ambulatory Visit: Payer: Self-pay | Admitting: Internal Medicine

## 2022-07-22 DIAGNOSIS — Z122 Encounter for screening for malignant neoplasm of respiratory organs: Secondary | ICD-10-CM

## 2022-07-25 ENCOUNTER — Other Ambulatory Visit: Payer: Self-pay | Admitting: Internal Medicine

## 2022-07-25 DIAGNOSIS — Z1231 Encounter for screening mammogram for malignant neoplasm of breast: Secondary | ICD-10-CM

## 2022-07-31 ENCOUNTER — Other Ambulatory Visit: Payer: Self-pay

## 2022-07-31 ENCOUNTER — Emergency Department (HOSPITAL_BASED_OUTPATIENT_CLINIC_OR_DEPARTMENT_OTHER): Payer: BC Managed Care – PPO

## 2022-07-31 ENCOUNTER — Emergency Department (HOSPITAL_BASED_OUTPATIENT_CLINIC_OR_DEPARTMENT_OTHER)
Admission: EM | Admit: 2022-07-31 | Discharge: 2022-07-31 | Disposition: A | Discharge status: Home / Self Care | Payer: BC Managed Care – PPO | Source: Home / Self Care | Attending: Emergency Medicine | Admitting: Emergency Medicine

## 2022-07-31 ENCOUNTER — Encounter (HOSPITAL_BASED_OUTPATIENT_CLINIC_OR_DEPARTMENT_OTHER): Payer: Self-pay

## 2022-07-31 DIAGNOSIS — R748 Abnormal levels of other serum enzymes: Secondary | ICD-10-CM | POA: Insufficient documentation

## 2022-07-31 DIAGNOSIS — R000 Tachycardia, unspecified: Secondary | ICD-10-CM | POA: Insufficient documentation

## 2022-07-31 DIAGNOSIS — R109 Unspecified abdominal pain: Secondary | ICD-10-CM | POA: Insufficient documentation

## 2022-07-31 DIAGNOSIS — R197 Diarrhea, unspecified: Secondary | ICD-10-CM

## 2022-07-31 DIAGNOSIS — K5792 Diverticulitis of intestine, part unspecified, without perforation or abscess without bleeding: Secondary | ICD-10-CM | POA: Diagnosis not present

## 2022-07-31 DIAGNOSIS — E860 Dehydration: Secondary | ICD-10-CM | POA: Insufficient documentation

## 2022-07-31 LAB — URINALYSIS, ROUTINE W REFLEX MICROSCOPIC
Bilirubin Urine: NEGATIVE
Glucose, UA: NEGATIVE mg/dL
Hgb urine dipstick: NEGATIVE
Ketones, ur: 40 mg/dL — AB
Leukocytes,Ua: NEGATIVE
Nitrite: NEGATIVE
Specific Gravity, Urine: 1.013 (ref 1.005–1.030)
pH: 5.5 (ref 5.0–8.0)

## 2022-07-31 LAB — COMPREHENSIVE METABOLIC PANEL
ALT: 24 U/L (ref 0–44)
AST: 26 U/L (ref 15–41)
Albumin: 5.3 g/dL — ABNORMAL HIGH (ref 3.5–5.0)
Alkaline Phosphatase: 102 U/L (ref 38–126)
Anion gap: 14 (ref 5–15)
BUN: 13 mg/dL (ref 6–20)
CO2: 22 mmol/L (ref 22–32)
Calcium: 10.6 mg/dL — ABNORMAL HIGH (ref 8.9–10.3)
Chloride: 96 mmol/L — ABNORMAL LOW (ref 98–111)
Creatinine, Ser: 0.99 mg/dL (ref 0.44–1.00)
GFR, Estimated: >60 mL/min (ref >60)
Glucose, Bld: 90 mg/dL (ref 70–99)
Potassium: 3.7 mmol/L (ref 3.5–5.1)
Sodium: 132 mmol/L — ABNORMAL LOW (ref 135–145)
Total Bilirubin: 1.2 mg/dL (ref 0.3–1.2)
Total Protein: 8.6 g/dL — ABNORMAL HIGH (ref 6.5–8.1)

## 2022-07-31 LAB — C DIFFICILE QUICK SCREEN W PCR REFLEX
C Diff antigen: NEGATIVE
C Diff interpretation: NOT DETECTED
C Diff toxin: NEGATIVE

## 2022-07-31 LAB — CBC
HCT: 50.1 % — ABNORMAL HIGH (ref 36.0–46.0)
Hemoglobin: 18.3 g/dL — ABNORMAL HIGH (ref 12.0–15.0)
MCH: 34.4 pg — ABNORMAL HIGH (ref 26.0–34.0)
MCHC: 36.5 g/dL — ABNORMAL HIGH (ref 30.0–36.0)
MCV: 94.2 fL (ref 80.0–100.0)
Platelets: 264 10*3/uL (ref 150–400)
RBC: 5.32 MIL/uL — ABNORMAL HIGH (ref 3.87–5.11)
RDW: 12.2 % (ref 11.5–15.5)
WBC: 10.1 10*3/uL (ref 4.0–10.5)
nRBC: 0 % (ref 0.0–0.2)

## 2022-07-31 LAB — LIPASE, BLOOD: Lipase: 101 U/L — ABNORMAL HIGH (ref 11–51)

## 2022-07-31 MED ORDER — LOPERAMIDE HCL 2 MG PO CAPS
4.0000 mg | ORAL_CAPSULE | Freq: Once | ORAL | Status: AC
  Administered 2022-07-31: 4 mg via ORAL
  Filled 2022-07-31: qty 2

## 2022-07-31 MED ORDER — CIPROFLOXACIN IN D5W 400 MG/200ML IV SOLN
400.0000 mg | Freq: Two times a day (BID) | INTRAVENOUS | Status: DC
  Administered 2022-07-31: 400 mg via INTRAVENOUS
  Filled 2022-07-31: qty 200

## 2022-07-31 MED ORDER — METRONIDAZOLE 500 MG PO TABS: 500.0000 mg | ORAL_TABLET | Freq: Two times a day (BID) | ORAL | 0 refills | Status: DC

## 2022-07-31 MED ORDER — LACTATED RINGERS IV BOLUS
1000.0000 mL | Freq: Once | INTRAVENOUS | Status: AC
  Administered 2022-07-31: 1000 mL via INTRAVENOUS

## 2022-07-31 MED ORDER — CIPROFLOXACIN HCL 500 MG PO TABS: 500.0000 mg | ORAL_TABLET | Freq: Two times a day (BID) | ORAL | 0 refills | Status: DC

## 2022-07-31 MED ORDER — METRONIDAZOLE 500 MG PO TABS
500.0000 mg | ORAL_TABLET | Freq: Once | ORAL | Status: AC
  Administered 2022-07-31: 500 mg via ORAL
  Filled 2022-07-31: qty 1

## 2022-07-31 MED ORDER — IOHEXOL 300 MG/ML  SOLN
100.0000 mL | Freq: Once | INTRAMUSCULAR | Status: AC | PRN
  Administered 2022-07-31: 100 mL via INTRAVENOUS

## 2022-07-31 NOTE — ED Triage Notes (Signed)
Patient here POV from Home.  Endorses Lightheadedness and Diarrhea for approximately 1 week.  No N/V. No Fevers. 10 lb Weight Loss since Symptoms began.   NAD Noted during Triage. A&Ox4. GCS 15. Ambulatory.

## 2022-07-31 NOTE — ED Notes (Signed)
Patient transported to CT 

## 2022-07-31 NOTE — ED Provider Notes (Signed)
[facility] EMERGENCY DEPT Provider Note   CSN: [id number] Arrival date & time: 07/31/22  1308     History {Add pertinent medical, surgical, social history, OB history to HPI:1} Chief Complaint  Patient presents with   Diarrhea    Rachel Weber is a 61 y.o. female.  HPI 61 yo female with watery diarrhea for 7 days, some crampy abdominal pain. Patient has decreased solids due to increasin gdiarrhea but is drinking fluids.  No fever or chills.  No sick contacts. No recent travelins or camping.      Home Medications Prior to Admission medications   Medication Sig Start Date End Date Taking? Authorizing Provider  ciprofloxacin (CIPRO) 500 MG tablet Take 1 tablet (500 mg total) by mouth every 12 (twelve) hours. 07/31/22  Yes Pattricia Boss, MD  ibuprofen (ADVIL) 800 MG tablet Take 1 tablet (800 mg total) by mouth every 8 (eight) hours as needed. 01/27/22   Lorenda Peck, DPM  metroNIDAZOLE (FLAGYL) 500 MG tablet Take 1 tablet (500 mg total) by mouth 2 (two) times daily. 07/31/22  Yes Pattricia Boss, MD  acetaminophen-codeine (TYLENOL #3) 300-30 MG tablet SMARTSIG:1-2 Tablet(s) By Mouth 2-3 Times Daily PRN 01/16/20   [provider]  albuterol (PROVENTIL HFA;VENTOLIN HFA) 108 (90 Base) MCG/ACT inhaler INHALE 1-2 PUFFS INTO THE LUNGS EVERY 4 (FOUR) HOURS AS NEEDED FOR WHEEZING OR SHORTNESS OF BREATH. 07/16/18   [name], MD  diphenhydrAMINE (BANOPHEN) 50 MG capsule TAKE 1 CAPSULE BY MOUTH EVERYDAY AT BEDTIME 01/08/20   [provider]  LORazepam (ATIVAN) 1 MG tablet Take 1 tablet (1 mg total) by mouth 2 (two) times daily as needed for anxiety. 06/16/18   [name], MD  neomycin-colistin-hydrocortisone-thonzonium (CORTISPORIN-TC) 3.02-18-09-0.5 MG/ML OTIC suspension Apply 2 drops to ingrown toenail removal site twice daily after soaking. 01/28/22   Lorenda Peck, DPM      Allergies    Oxycodone    Review of Systems   Review of  Systems  Physical Exam Updated Vital Signs BP 126/80   Pulse (!) 105   Temp 98 F (36.7 C) (Oral)   Resp (!) 23   Ht 1.753 m ('5\' 9"'$ )   Wt 78 kg   SpO2 93%   BMI 25.39 kg/m  Physical Exam Vitals and nursing note reviewed.  Constitutional:      Appearance: Normal appearance.  HENT:     Head: Normocephalic.     Right Ear: External ear normal.     Nose: Nose normal.     Mouth/Throat:     Mouth: Mucous membranes are dry.     Pharynx: Oropharynx is clear.  Eyes:     Extraocular Movements: Extraocular movements intact.     Pupils: Pupils are equal, round, and reactive to light.  Cardiovascular:     Rate and Rhythm: Regular rhythm. Tachycardia present.  Pulmonary:     Effort: Pulmonary effort is normal.     Breath sounds: Normal breath sounds.  Abdominal:     General: Bowel sounds are normal.     Palpations: Abdomen is soft.     Tenderness: There is abdominal tenderness.     Comments: Tenderness to palpation epigastrium to the left lower quadrant  Musculoskeletal:        General: Normal range of motion.     Cervical back: Normal range of motion.  Skin:    General: Skin is warm and dry.     Capillary Refill: Capillary refill takes less than 2 seconds.  Neurological:  General: No focal deficit present.     Mental Status: She is alert.  Psychiatric:        Mood and Affect: Mood normal.     ED Results / Procedures / Treatments   Labs (all labs ordered are listed, but only abnormal results are displayed) Labs Reviewed  LIPASE, BLOOD - Abnormal; Notable for the following components:      Result Value   Lipase 101 (*)    All other components within normal limits  COMPREHENSIVE METABOLIC PANEL - Abnormal; Notable for the following components:   Sodium 132 (*)    Chloride 96 (*)    Calcium 10.6 (*)    Total Protein 8.6 (*)    Albumin 5.3 (*)    All other components within normal limits  CBC - Abnormal; Notable for the following components:   RBC 5.32 (*)     Hemoglobin 18.3 (*)    HCT 50.1 (*)    MCH 34.4 (*)    MCHC 36.5 (*)    All other components within normal limits  URINALYSIS, ROUTINE W REFLEX MICROSCOPIC - Abnormal; Notable for the following components:   Ketones, ur 40 (*)    Protein, ur TRACE (*)    All other components within normal limits  GASTROINTESTINAL PANEL BY PCR, STOOL (REPLACES STOOL CULTURE)    EKG None  Radiology CT ABDOMEN PELVIS W CONTRAST  Result Date: 07/31/2022 CLINICAL DATA:  Dizziness, diarrhea, weight loss EXAM: CT ABDOMEN AND PELVIS WITH CONTRAST TECHNIQUE: Multidetector CT imaging of the abdomen and pelvis was performed using the standard protocol following bolus administration of intravenous contrast. RADIATION DOSE REDUCTION: This exam was performed according to the departmental dose-optimization program which includes automated exposure control, adjustment of the mA and/or kV according to patient size and/or use of iterative reconstruction technique. CONTRAST:  176m OMNIPAQUE IOHEXOL 300 MG/ML  SOLN COMPARISON:  None Available. FINDINGS: Lower chest: Small linear densities in posterior right lower lung field may suggest minimal scarring. Hepatobiliary: There is fatty infiltration in liver. There is no significant dilation of intrahepatic bile ducts. Distal common bile duct in the head of the pancreas measures 10 mm. Gallbladder is distended. This may be due to fasting state. There is no wall thickening. There is no fluid around the gallbladder. Pancreas: There is minimal prominence of pancreatic duct. No significant inflammatory stranding is noted adjacent to pancreas. There are no loculated fluid collections in or around the pancreas. There is a tiny punctate calcification in the body. Spleen: Unremarkable. Adrenals/Urinary Tract: Adrenals are unremarkable. There is no hydronephrosis. There are no renal or ureteral stones. Urinary bladder is unremarkable. Stomach/Bowel: Small hiatal hernia is seen. Stomach is  unremarkable. Small bowel loops are not dilated. Appendix is not seen. There is fluid in the lumen of: And rectum. There is mild mucosal enhancement in colon. There is no significant focal wall thickening. There is no pericolic stranding. Vascular/Lymphatic: Scattered arterial calcifications are seen. Reproductive: Unremarkable. Other: There is no ascites or pneumoperitoneum. Musculoskeletal: Unremarkable. IMPRESSION: There is no evidence of intestinal obstruction or pneumoperitoneum. There is no hydronephrosis. There is mild mucosal enhancement in colon. There is fluid in the lumen of colon and rectum. Findings suggest possible nonspecific enterocolitis. Small hiatal hernia.  Fatty liver. Other findings as described in the body of the report. Electronically Signed   By: PElmer PickerM.D.   On: 07/31/2022 16:33    Procedures Procedures  {Document cardiac monitor, telemetry assessment procedure when appropriate:1}  Medications Ordered in  ED Medications  ciprofloxacin (CIPRO) IVPB 400 mg (has no administration in time range)  metroNIDAZOLE (FLAGYL) tablet 500 mg (has no administration in time range)  loperamide (IMODIUM) capsule 4 mg (has no administration in time range)  lactated ringers bolus 1,000 mL (1,000 mLs Intravenous New Bag/Given 07/31/22 1633)  iohexol (OMNIPAQUE) 300 MG/ML solution 100 mL (100 mLs Intravenous Contrast Given 07/31/22 1606)    ED Course/ Medical Decision Making/ A&P Clinical Course as of 07/31/22 1733  Sun Jul 31, 2022  1533 Lipase elevated at 100 [DR]  1732 CBC is reviewed and interpreted and significant for elevated hemoglobin at 18 [DR]  3785 Complete metabolic panel is reviewed and interpreted and is significant for mild hyponatremia with sodium 132, hypercalcemia at 10.6 [DR]    Clinical Course User Index [DR] Pattricia Boss, MD                           Medical Decision Making 61 year old female with no significant past medical history presents today  complaining of diarrhea for the past 7 days.  She has had decreased p.o. intake because any food intake has made her diarrhea worse.  However, she has been taking fluids without difficulty.  She denies significant history of alcohol abuse, states that she did drink half a glass of wine last night Here on exam patient is tachycardic, she has mild abdominal tenderness to palpation Labs are significant for lipase elevated at 101 hemoglobin is elevated Plan IV fluids and CT of abdomen and pelvis  Amount and/or Complexity of Data Reviewed Labs: ordered. Decision-making details documented in ED Course. Radiology: ordered and independent interpretation performed. Decision-making details documented in ED Course.  Risk Prescription drug management.     {Document critical care time when appropriate:1} {Document review of labs and clinical decision tools ie heart score, Chads2Vasc2 etc:1}  {Document your independent review of radiology images, and any outside records:1} {Document your discussion with family members, caretakers, and with consultants:1} {Document social determinants of health affecting pt's care:1} {Document your decision making why or why not admission, treatments were needed:1} Final Clinical Impression(s) / ED Diagnoses Final diagnoses:  None    Rx / DC Orders ED Discharge Orders          Ordered    ciprofloxacin (CIPRO) 500 MG tablet  Every 12 hours        07/31/22 1731    metroNIDAZOLE (FLAGYL) 500 MG tablet  2 times daily        07/31/22 1731

## 2022-07-31 NOTE — Discharge Instructions (Addendum)
Please drink Gatorade mixed half-and-half with water Drink frequent only and is much as you can tolerate Do not drink any alcohol Take all antibiotics as prescribed Recheck with a primary doctor within the next week You will need recheck of your lipase and calcium

## 2022-08-01 LAB — GASTROINTESTINAL PANEL BY PCR, STOOL (REPLACES STOOL CULTURE)

## 2022-08-03 ENCOUNTER — Inpatient Hospital Stay (HOSPITAL_COMMUNITY): Payer: BC Managed Care – PPO

## 2022-08-03 ENCOUNTER — Ambulatory Visit: Payer: BC Managed Care – PPO

## 2022-08-03 ENCOUNTER — Encounter (HOSPITAL_BASED_OUTPATIENT_CLINIC_OR_DEPARTMENT_OTHER): Payer: Self-pay | Admitting: Emergency Medicine

## 2022-08-03 ENCOUNTER — Encounter (HOSPITAL_COMMUNITY): Payer: Self-pay

## 2022-08-03 ENCOUNTER — Emergency Department (HOSPITAL_BASED_OUTPATIENT_CLINIC_OR_DEPARTMENT_OTHER): Payer: BC Managed Care – PPO

## 2022-08-03 ENCOUNTER — Inpatient Hospital Stay (HOSPITAL_BASED_OUTPATIENT_CLINIC_OR_DEPARTMENT_OTHER)
Admission: EM | Admit: 2022-08-03 | Discharge: 2022-08-04 | DRG: 392 | Discharge status: Against Medical Advice | Payer: BC Managed Care – PPO | Attending: Family Medicine | Admitting: Family Medicine

## 2022-08-03 ENCOUNTER — Other Ambulatory Visit: Payer: Self-pay

## 2022-08-03 DIAGNOSIS — K5792 Diverticulitis of intestine, part unspecified, without perforation or abscess without bleeding: Principal | ICD-10-CM | POA: Diagnosis present

## 2022-08-03 DIAGNOSIS — R7401 Elevation of levels of liver transaminase levels: Secondary | ICD-10-CM | POA: Diagnosis present

## 2022-08-03 DIAGNOSIS — R197 Diarrhea, unspecified: Secondary | ICD-10-CM

## 2022-08-03 DIAGNOSIS — Z5329 Procedure and treatment not carried out because of patient's decision for other reasons: Secondary | ICD-10-CM | POA: Diagnosis present

## 2022-08-03 DIAGNOSIS — Z720 Tobacco use: Secondary | ICD-10-CM

## 2022-08-03 DIAGNOSIS — F411 Generalized anxiety disorder: Secondary | ICD-10-CM | POA: Diagnosis present

## 2022-08-03 DIAGNOSIS — Z66 Do not resuscitate: Secondary | ICD-10-CM | POA: Diagnosis present

## 2022-08-03 DIAGNOSIS — R7989 Other specified abnormal findings of blood chemistry: Secondary | ICD-10-CM

## 2022-08-03 DIAGNOSIS — K831 Obstruction of bile duct: Secondary | ICD-10-CM

## 2022-08-03 DIAGNOSIS — R739 Hyperglycemia, unspecified: Secondary | ICD-10-CM

## 2022-08-03 DIAGNOSIS — A09 Infectious gastroenteritis and colitis, unspecified: Secondary | ICD-10-CM | POA: Diagnosis present

## 2022-08-03 DIAGNOSIS — E860 Dehydration: Secondary | ICD-10-CM | POA: Diagnosis present

## 2022-08-03 DIAGNOSIS — Z885 Allergy status to narcotic agent status: Secondary | ICD-10-CM | POA: Diagnosis not present

## 2022-08-03 DIAGNOSIS — E876 Hypokalemia: Secondary | ICD-10-CM | POA: Diagnosis present

## 2022-08-03 DIAGNOSIS — K838 Other specified diseases of biliary tract: Secondary | ICD-10-CM | POA: Diagnosis present

## 2022-08-03 DIAGNOSIS — Z79899 Other long term (current) drug therapy: Secondary | ICD-10-CM

## 2022-08-03 DIAGNOSIS — E871 Hypo-osmolality and hyponatremia: Secondary | ICD-10-CM | POA: Diagnosis present

## 2022-08-03 DIAGNOSIS — R748 Abnormal levels of other serum enzymes: Secondary | ICD-10-CM | POA: Diagnosis present

## 2022-08-03 DIAGNOSIS — Z87891 Personal history of nicotine dependence: Secondary | ICD-10-CM

## 2022-08-03 LAB — URINALYSIS, ROUTINE W REFLEX MICROSCOPIC
Bilirubin Urine: NEGATIVE
Glucose, UA: NEGATIVE mg/dL
Hgb urine dipstick: NEGATIVE
Ketones, ur: NEGATIVE mg/dL
Nitrite: NEGATIVE
Specific Gravity, Urine: >1.046 — ABNORMAL HIGH (ref 1.005–1.030)
pH: 6 (ref 5.0–8.0)

## 2022-08-03 LAB — COMPREHENSIVE METABOLIC PANEL
ALT: 111 U/L — ABNORMAL HIGH (ref 0–44)
ALT: 93 U/L — ABNORMAL HIGH (ref 0–44)
AST: 117 U/L — ABNORMAL HIGH (ref 15–41)
AST: 87 U/L — ABNORMAL HIGH (ref 15–41)
Albumin: 4.9 g/dL (ref 3.5–5.0)
Albumin: 4.4 g/dL (ref 3.5–5.0)
Alkaline Phosphatase: 252 U/L — ABNORMAL HIGH (ref 38–126)
Alkaline Phosphatase: 207 U/L — ABNORMAL HIGH (ref 38–126)
Anion gap: 16 — ABNORMAL HIGH (ref 5–15)
Anion gap: 10 (ref 5–15)
BUN: 6 mg/dL (ref 6–20)
BUN: 7 mg/dL (ref 6–20)
CO2: 20 mmol/L — ABNORMAL LOW (ref 22–32)
CO2: 22 mmol/L (ref 22–32)
Calcium: 10.3 mg/dL (ref 8.9–10.3)
Calcium: 9.6 mg/dL (ref 8.9–10.3)
Chloride: 94 mmol/L — ABNORMAL LOW (ref 98–111)
Chloride: 102 mmol/L (ref 98–111)
Creatinine, Ser: 1.09 mg/dL — ABNORMAL HIGH (ref 0.44–1.00)
Creatinine, Ser: 0.91 mg/dL (ref 0.44–1.00)
GFR, Estimated: 59 mL/min — ABNORMAL LOW (ref >60)
GFR, Estimated: >60 mL/min (ref >60)
Glucose, Bld: 158 mg/dL — ABNORMAL HIGH (ref 70–99)
Glucose, Bld: 114 mg/dL — ABNORMAL HIGH (ref 70–99)
Potassium: 2.9 mmol/L — ABNORMAL LOW (ref 3.5–5.1)
Potassium: 2.7 mmol/L — CL (ref 3.5–5.1)
Sodium: 130 mmol/L — ABNORMAL LOW (ref 135–145)
Sodium: 134 mmol/L — ABNORMAL LOW (ref 135–145)
Total Bilirubin: 3.9 mg/dL — ABNORMAL HIGH (ref 0.3–1.2)
Total Bilirubin: 2.8 mg/dL — ABNORMAL HIGH (ref 0.3–1.2)
Total Protein: 8 g/dL (ref 6.5–8.1)
Total Protein: 7.7 g/dL (ref 6.5–8.1)

## 2022-08-03 LAB — CBG MONITORING, ED: Glucose-Capillary: 175 mg/dL — ABNORMAL HIGH (ref 70–99)

## 2022-08-03 LAB — CBC
HCT: 45.3 % (ref 36.0–46.0)
Hemoglobin: 17.2 g/dL — ABNORMAL HIGH (ref 12.0–15.0)
MCH: 34.3 pg — ABNORMAL HIGH (ref 26.0–34.0)
MCHC: 38 g/dL — ABNORMAL HIGH (ref 30.0–36.0)
MCV: 90.2 fL (ref 80.0–100.0)
Platelets: 262 10*3/uL (ref 150–400)
RBC: 5.02 MIL/uL (ref 3.87–5.11)
RDW: 11.9 % (ref 11.5–15.5)
WBC: 13.8 10*3/uL — ABNORMAL HIGH (ref 4.0–10.5)
nRBC: 0 % (ref 0.0–0.2)

## 2022-08-03 LAB — HEMOGLOBIN A1C
Hgb A1c MFr Bld: 4.6 % — ABNORMAL LOW (ref 4.8–5.6)
Mean Plasma Glucose: 85.32 mg/dL

## 2022-08-03 LAB — HEPATITIS PANEL, ACUTE
HCV Ab: REACTIVE — AB
Hep A IgM: NONREACTIVE
Hep B C IgM: NONREACTIVE
Hepatitis B Surface Ag: NONREACTIVE

## 2022-08-03 LAB — PROTIME-INR
INR: 1.1 (ref 0.8–1.2)
Prothrombin Time: 14 seconds (ref 11.4–15.2)

## 2022-08-03 LAB — MAGNESIUM: Magnesium: 1.8 mg/dL (ref 1.7–2.4)

## 2022-08-03 LAB — HIV ANTIBODY (ROUTINE TESTING W REFLEX): HIV Screen 4th Generation wRfx: NONREACTIVE

## 2022-08-03 LAB — LIPASE, BLOOD: Lipase: 385 U/L — ABNORMAL HIGH (ref 11–51)

## 2022-08-03 MED ORDER — PROCHLORPERAZINE EDISYLATE 10 MG/2ML IJ SOLN: 10.0000 mg | Freq: Four times a day (QID) | INTRAMUSCULAR | Status: DC | PRN

## 2022-08-03 MED ORDER — FLUTICASONE PROPIONATE 50 MCG/ACT NA SUSP
1.0000 | Freq: Every day | NASAL | Status: DC
  Filled 2022-08-03: qty 16

## 2022-08-03 MED ORDER — POTASSIUM CHLORIDE 10 MEQ/100ML IV SOLN
10.0000 meq | INTRAVENOUS | Status: AC
  Administered 2022-08-03 – 2022-08-04 (×6): 10 meq via INTRAVENOUS
  Filled 2022-08-03: qty 100

## 2022-08-03 MED ORDER — HYDROMORPHONE HCL 1 MG/ML IJ SOLN
0.5000 mg | Freq: Once | INTRAMUSCULAR | Status: AC
  Administered 2022-08-03: 0.5 mg via INTRAVENOUS
  Filled 2022-08-03: qty 1

## 2022-08-03 MED ORDER — ONDANSETRON HCL 4 MG/2ML IJ SOLN
4.0000 mg | Freq: Once | INTRAMUSCULAR | Status: AC
  Administered 2022-08-03: 4 mg via INTRAVENOUS
  Filled 2022-08-03: qty 2

## 2022-08-03 MED ORDER — ENOXAPARIN SODIUM 40 MG/0.4ML IJ SOSY
40.0000 mg | PREFILLED_SYRINGE | INTRAMUSCULAR | Status: DC
  Filled 2022-08-03: qty 0.4

## 2022-08-03 MED ORDER — SODIUM CHLORIDE 0.9 % IV BOLUS (SEPSIS)
1000.0000 mL | Freq: Once | INTRAVENOUS | Status: AC
  Administered 2022-08-03: 1000 mL via INTRAVENOUS

## 2022-08-03 MED ORDER — POTASSIUM CHLORIDE 10 MEQ/100ML IV SOLN
10.0000 meq | INTRAVENOUS | Status: AC
  Administered 2022-08-03 (×2): 10 meq via INTRAVENOUS
  Filled 2022-08-03 (×2): qty 100

## 2022-08-03 MED ORDER — POTASSIUM CHLORIDE 10 MEQ/100ML IV SOLN
10.0000 meq | INTRAVENOUS | Status: DC
  Administered 2022-08-03: 10 meq via INTRAVENOUS
  Filled 2022-08-03 (×4): qty 100

## 2022-08-03 MED ORDER — HYDROMORPHONE HCL 1 MG/ML IJ SOLN
0.5000 mg | INTRAMUSCULAR | Status: DC | PRN
  Administered 2022-08-03 – 2022-08-04 (×3): 0.5 mg via INTRAVENOUS
  Filled 2022-08-03 (×3): qty 0.5

## 2022-08-03 MED ORDER — ALBUTEROL SULFATE (2.5 MG/3ML) 0.083% IN NEBU: 3.0000 mL | INHALATION_SOLUTION | RESPIRATORY_TRACT | Status: DC | PRN

## 2022-08-03 MED ORDER — PIPERACILLIN-TAZOBACTAM 3.375 G IVPB
3.3750 g | Freq: Three times a day (TID) | INTRAVENOUS | Status: DC
  Administered 2022-08-03 – 2022-08-04 (×2): 3.375 g via INTRAVENOUS
  Filled 2022-08-03 (×2): qty 50

## 2022-08-03 MED ORDER — IOHEXOL 300 MG/ML  SOLN
80.0000 mL | Freq: Once | INTRAMUSCULAR | Status: AC | PRN
  Administered 2022-08-03: 80 mL via INTRAVENOUS

## 2022-08-03 MED ORDER — FENTANYL CITRATE PF 50 MCG/ML IJ SOSY
50.0000 ug | PREFILLED_SYRINGE | Freq: Once | INTRAMUSCULAR | Status: AC
  Administered 2022-08-03: 50 ug via INTRAVENOUS
  Filled 2022-08-03: qty 1

## 2022-08-03 MED ORDER — SODIUM CHLORIDE 0.9 % IV SOLN
1000.0000 mL | INTRAVENOUS | Status: DC
  Administered 2022-08-03 (×2): 1000 mL via INTRAVENOUS

## 2022-08-03 MED ORDER — PIPERACILLIN-TAZOBACTAM 3.375 G IVPB 30 MIN
3.3750 g | Freq: Once | INTRAVENOUS | Status: AC
  Administered 2022-08-03: 3.375 g via INTRAVENOUS
  Filled 2022-08-03: qty 50

## 2022-08-03 NOTE — Consult Note (Signed)
Reason for Consult: Abnormal CT abnormal liver tests Referring Physician: Hospital team  Rachel Weber is an 61 y.o. female.  HPI: Patient seen and examined and our office computer chart and her hospital computer chart was reviewed and case discussed with the hospital team as well as her husband and she has had no GI issues until a few months ago when bloating started and she did change her diet and was better for about a month and then she had 2 to 3 weeks of significant diarrhea went to the emergency room 3 days ago and a CT and labs were reviewed and she was started on Cipro and Flagyl which made her diarrhea worse and gave her nausea and she has lost a moderate amount of weight in the last month she has not had any previous GI issues family history is negative for any GI problems has not had any previous GI work-up or procedures including a colonoscopy which we discussed and at home she has had no vomiting no blood in her bowels and no fever and has no other complaints and no sick contacts or any other new medicines other than above Past Medical History:  Diagnosis Date   Allergy    Anxiety    Cataract    TMJ arthritis     Past Surgical History:  Procedure Laterality Date   APPENDECTOMY     BREAST SURGERY     COSMETIC SURGERY      Family History  Problem Relation Age of Onset   Cancer Mother     Social History:  reports that she has quit smoking. Her smoking use included cigarettes. She smoked an average of .25 packs per day. She has never used smokeless tobacco. She reports current alcohol use. She reports that she does not currently use drugs.  Allergies:  Allergies  Allergen Reactions   Oxycodone Other (See Comments)    Hallucinations    Medications: I have reviewed the patient's current medications.  Results for orders placed or performed during the hospital encounter of 08/03/22 (from the past 48 hour(s))  CBG monitoring, ED     Status: Abnormal   Collection Time:  08/03/22 12:53 AM  Result Value Ref Range   Glucose-Capillary 175 (H) 70 - 99 mg/dL    Comment: Glucose reference range applies only to samples taken after fasting for at least 8 hours.  Lipase, blood     Status: Abnormal   Collection Time: 08/03/22 12:56 AM  Result Value Ref Range   Lipase 385 (H) 11 - 51 U/L    Comment: Performed at [facility], [address], [place], [place] [address]  Comprehensive metabolic panel     Status: Abnormal   Collection Time: 08/03/22 12:56 AM  Result Value Ref Range   Sodium 130 (L) 135 - 145 mmol/L   Potassium 2.9 (L) 3.5 - 5.1 mmol/L   Chloride 94 (L) 98 - 111 mmol/L   CO2 20 (L) 22 - 32 mmol/L   Glucose, Bld 158 (H) 70 - 99 mg/dL    Comment: Glucose reference range applies only to samples taken after fasting for at least 8 hours.   BUN 6 6 - 20 mg/dL   Creatinine, Ser 1.09 (H) 0.44 - 1.00 mg/dL   Calcium 10.3 8.9 - 10.3 mg/dL   Total Protein 8.0 6.5 - 8.1 g/dL   Albumin 4.9 3.5 - 5.0 g/dL   AST 117 (H) 15 - 41 U/L   ALT 111 (H) 0 - 44  U/L   Alkaline Phosphatase 252 (H) 38 - 126 U/L   Total Bilirubin 3.9 (H) 0.3 - 1.2 mg/dL   GFR, Estimated 59 (L) >60 mL/min    Comment: (NOTE) Calculated using the CKD-EPI Creatinine Equation (2021)    Anion gap 16 (H) 5 - 15    Comment: Performed at [facility], [address] [place], [place] [address]  CBC     Status: Abnormal   Collection Time: 08/03/22 12:56 AM  Result Value Ref Range   WBC 13.8 (H) 4.0 - 10.5 K/uL   RBC 5.02 3.87 - 5.11 MIL/uL   Hemoglobin 17.2 (H) 12.0 - 15.0 g/dL   HCT 45.3 36.0 - 46.0 %   MCV 90.2 80.0 - 100.0 fL   MCH 34.3 (H) 26.0 - 34.0 pg   MCHC 38.0 (H) 30.0 - 36.0 g/dL   RDW 11.9 11.5 - 15.5 %   Platelets 262 150 - 400 K/uL   nRBC 0.0 0.0 - 0.2 %    Comment: Performed at [facility], [address] [place], [place] [address]  Urinalysis, Routine w reflex microscopic Urine, Clean Catch     Status: Abnormal    Collection Time: 08/03/22 12:57 AM  Result Value Ref Range   Color, Urine YELLOW YELLOW   APPearance CLEAR CLEAR   Specific Gravity, Urine >1.046 (H) 1.005 - 1.030   pH 6.0 5.0 - 8.0   Glucose, UA NEGATIVE NEGATIVE mg/dL   Hgb urine dipstick NEGATIVE NEGATIVE   Bilirubin Urine NEGATIVE NEGATIVE   Ketones, ur NEGATIVE NEGATIVE mg/dL   Protein, ur TRACE (A) NEGATIVE mg/dL   Nitrite NEGATIVE NEGATIVE   Leukocytes,Ua SMALL (A) NEGATIVE   RBC / HPF 0-5 0 - 5 RBC/hpf   WBC, UA 6-10 0 - 5 WBC/hpf   Bacteria, UA FEW (A) NONE SEEN   Squamous Epithelial / LPF 0-5 0 - 5    Comment: Performed at [facility], [address], [place], [place] [id number]  Comprehensive metabolic panel     Status: Abnormal   Collection Time: 08/03/22  3:43 PM  Result Value Ref Range   Sodium 134 (L) 135 - 145 mmol/L   Potassium 2.7 (LL) 3.5 - 5.1 mmol/L    Comment: CRITICAL RESULT CALLED TO, READ BACK BY AND VERIFIED WITH WAY H RN '@1700'$  ON 08/03/22 BY KERLANDIA C.    Chloride 102 98 - 111 mmol/L   CO2 22 22 - 32 mmol/L   Glucose, Bld 114 (H) 70 - 99 mg/dL    Comment: Glucose reference range applies only to samples taken after fasting for at least 8 hours.   BUN 7 6 - 20 mg/dL   Creatinine, Ser 0.91 0.44 - 1.00 mg/dL   Calcium 9.6 8.9 - 10.3 mg/dL   Total Protein 7.7 6.5 - 8.1 g/dL   Albumin 4.4 3.5 - 5.0 g/dL   AST 87 (H) 15 - 41 U/L   ALT 93 (H) 0 - 44 U/L   Alkaline Phosphatase 207 (H) 38 - 126 U/L   Total Bilirubin 2.8 (H) 0.3 - 1.2 mg/dL   GFR, Estimated >60 >60 mL/min    Comment: (NOTE) Calculated using the CKD-EPI Creatinine Equation (2021)    Anion gap 10 5 - 15    Comment: Performed at [facility], [place] [address], [place], [place] [address]  Magnesium     Status: None   Collection Time: 08/03/22  3:43 PM  Result Value Ref Range  Magnesium 1.8 1.7 - 2.4 mg/dL    Comment: Performed at [facility], [place] [address], [place],  [place] [address]    US Abdomen Limited RUQ (LIVER/GB)  Result Date: 08/03/2022 CLINICAL DATA:  61 year old female with elevated liver function tests. EXAM: ULTRASOUND ABDOMEN LIMITED RIGHT UPPER QUADRANT COMPARISON:  CT abdomen pelvis from the same day. FINDINGS: Gallbladder: Mildly distended with layering sludge. No gallbladder wall thickening or pericholecystic fluid. No sonographic Murphy sign. Common bile duct: Diameter: 1.0 cm Liver: No focal lesion identified. Diffusely increased echogenicity. Smooth contour. Portal vein is patent on color Doppler imaging with normal direction of blood flow towards the liver. Other: No perihepatic ascites. IMPRESSION: 1. Mild hepatic steatosis. 2. Gallbladder sludge, no sonographic evidence of acute cholecystitis. Ruthann Cancer, MD [facility] Radiology Electronically Signed   By: Ruthann Cancer M.D.   On: 08/03/2022 16:31   CT ABDOMEN PELVIS W CONTRAST  Result Date: 08/03/2022 CLINICAL DATA:  Abdominal pain EXAM: CT ABDOMEN AND PELVIS WITH CONTRAST TECHNIQUE: Multidetector CT imaging of the abdomen and pelvis was performed using the standard protocol following bolus administration of intravenous contrast. RADIATION DOSE REDUCTION: This exam was performed according to the departmental dose-optimization program which includes automated exposure control, adjustment of the mA and/or kV according to patient size and/or use of iterative reconstruction technique. CONTRAST:  72m OMNIPAQUE IOHEXOL 300 MG/ML  SOLN COMPARISON:  07/31/2022 FINDINGS: Lower chest: Mild atelectasis is noted in the right lung base. Hepatobiliary: Gallbladder is well distended. Liver is well visualized without focal mass. Intrahepatic biliary ductal dilatation is seen new from the prior exam. The common bile duct is prominent measuring up to 12 mm. No definitive choledocholithiasis is seen. Pancreas: Unremarkable. No pancreatic ductal dilatation or surrounding  inflammatory changes. Spleen: Normal in size without focal abnormality. Adrenals/Urinary Tract: Adrenal glands are within normal limits. Kidneys demonstrate a normal enhancement pattern. No renal calculi or obstructive changes are noted. Normal excretion is noted on delayed images. Ureters are within normal limits. The bladder is unremarkable. Stomach/Bowel: The appendix has been surgically removed. Changes of mild diverticulitis are seen in the sigmoid colon. No perforation or abscess formation is noted. These changes are new from the prior exam. Small bowel and stomach are unremarkable with the exception of a small sliding-type hiatal hernia. Vascular/Lymphatic: Aortic atherosclerosis. No enlarged abdominal or pelvic lymph nodes. Reproductive: Uterus and bilateral adnexa are unremarkable. Other: No abdominal wall hernia or abnormality. No abdominopelvic ascites. Musculoskeletal: No acute or significant osseous findings. IMPRESSION: Changes of very mild diverticulitis in the sigmoid colon without perforation or abscess formation. This is new from the prior exam. No evidence of pancreatitis. Dilatation of the common bile duct 12 mm although no choledocholithiasis is seen. Very mild intrahepatic ductal dilatation is seen as well. Electronically Signed   By: MInez CatalinaM.D.   On: 08/03/2022 03:34    ROS negative except above Blood pressure 126/87, pulse 82, temperature 98 F (36.7 C), temperature source Oral, resp. rate 18, SpO2 95 %. Physical Exam pleasant no acute distress in good spirits exam for me pertinent for some left lower quadrant discomfort without guarding or rebound and no upper abdominal tenderness CT and ultrasound and labs reviewed including work-up 3 days ago  Assessment/Plan: Multiple GI issues including dilated CBD new increase liver test new slightly elevated lipase and a CT and physical exam worrisome for diverticulitis Plan: The risk benefits methods and success rate of ERCP was  discussed and will  wait on MRCP to decide if that is what is needed and will treat with antibiotics for diverticulitis in the meantime but will allow clear liquids and will check on tomorrow and we answered all of the patient and her husband's questions and we discussed a colonoscopy at some point in the future based on t this episode Shameca Landen E 08/03/2022, 5:36 PM

## 2022-08-03 NOTE — ED Triage Notes (Signed)
Patient reports 10 days of nausea  and diarrhea. Weakness. Not eating much for last 10 day. Took imodium at home

## 2022-08-03 NOTE — Progress Notes (Signed)
Pharmacy Antibiotic Note  Rachel Weber is a 61 y.o. female admitted on 08/03/2022 with acute diverticulitis.  Pharmacy has been consulted for zosyn dosing.  Plan: Zosyn 3.375g IV q8h (4 hour infusion).   Pharmacy to sign off  Temp (24hrs), Avg:98 F (36.7 C), Min:97.3 F (36.3 C), Max:98.4 F (36.9 C)  Recent Labs  Lab 07/31/22 1336 08/03/22 0056  WBC 10.1 13.8*  CREATININE 0.99 1.09*    Estimated Creatinine Clearance: 58.1 mL/min (A) (by C-G formula based on SCr of 1.09 mg/dL (H)).    Allergies  Allergen Reactions   Oxycodone Other (See Comments)    Hallucinations    Thank you for allowing pharmacy to be a part of this patient's care.  Eudelia Bunch, Pharm.D 08/03/2022 3:59 PM

## 2022-08-03 NOTE — Progress Notes (Signed)
Transferring facility: [facility] Requesting provider: Dr. Leonette Monarch (EDP at [facility]) Reason for transfer: admission for further evaluation and management of acute diverticulitis with concern for failure of outpatient antibiotics, in addition to new onset transaminitis and mildly dilated common bile duct, without e/o stones or acute cholecystitis on CT scan.   61 year old female who presented to [facility] ED this evening complaining of worsening left lower quadrant abdominal discomfort a/w diarrhea, nausea, and vomiting.   She had initially presented to [facility] emergency department on 07/31/2022 complaining, at that time, of 1 week of abdominal discomfort associated with diarrhea.  She was diagnosed at that time with suspected enterocolitis and d/c'ed to home from the ED on po Cipro/Flagyl.  However, over the last 2 days, she has noted progression of intensity of her left lower quadrant abdominal discomfort, increased frequency of diarrhea, as well as interval development of nausea/vomiting, limiting her ability to tolerate p.o., including the aforementioned outpatient oral antibiotics.  Denies any right upper quadrant nor any epigastric discomfort.   Vital signs in the ED today were notable for the following: Afebrile; initial mild tachycardia improved following IV fluids; normotensive blood pressures.  Labs were notable for CBC, which showed wbc of 13,800.  CMP notable for interval development of mild transaminitis, noting alkaline phosphatase 252, AST 117, ALT 111, total bilirubin 3.9.  Is also noted to have mild hypokalemia.  Imaging notable for CT abdomen/pelvis, which shows evidence of acute diverticulitis without bowel obstruction, perforation, or abscess, and also notes mild dilation of the common bile duct without evidence of choledocholithiasis or acute cholecystitis.  Additionally no evidence of acute pancreatitis on this imaging.  Started on Zosyn at [facility] this evening.    Subsequently, I  accepted this patient for transfer for inpatient admission to a med telemetry bed at [facility] or [facility] (first available).  Of note, case has not been discussed with GI or gen surg.       Check [email or web address] for on-call coverage.   Nursing staff, Please call [place] number on Amion as soon as patient's arrival, so appropriate admitting provider can evaluate the pt.     Babs Bertin, DO Hospitalist

## 2022-08-03 NOTE — H&P (Signed)
History and Physical    Patient: Rachel Weber HKV:[id number] DOB: 07/29/1961 DOA: 08/03/2022 DOS: the patient was seen and examined on 08/03/2022 PCP: Pcp, No  Patient coming from: Home  Chief Complaint:  Chief Complaint  Patient presents with   Diarrhea   HPI: Rachel Weber is a 61 y.o. female with medical history significant of GAD, tobacco abuse. Presenting with abdominal pain and diarrhea. She has been dealing with unexplained bloating for several months now. She acquired a new PCP recently and began a work up. She notes that 10 days ago, she started having diarrhea. She tried imodium, but it did not help. She had N/V but no fever. She had crampy abdominal pain as well. She went to the ED on 8/13 and was diagnosed with enterocolitis. She was started on cipro and flagyl. She was able to take those medications as prescribe. However, he pain and diarrhea did not improve. When she started experiencing sharper pains last night, she decided to come to the ED for evaluation. She denies any sick contacts, exotic/undercooked foods, or recent travel. She denies any other aggravating or alleviating factors.     Review of Systems: As mentioned in the history of present illness. All other systems reviewed and are negative. Past Medical History:  Diagnosis Date   Allergy    Anxiety    Cataract    TMJ arthritis    Past Surgical History:  Procedure Laterality Date   APPENDECTOMY     BREAST SURGERY     COSMETIC SURGERY     Social History:  reports that she has quit smoking. Her smoking use included cigarettes. She smoked an average of .25 packs per day. She has never used smokeless tobacco. She reports current alcohol use. She reports that she does not currently use drugs.  Allergies  Allergen Reactions   Oxycodone Other (See Comments)    Hallucinations    Family History  Problem Relation Age of Onset   Cancer Mother     Prior to Admission medications   Medication Sig Start  Date End Date Taking? Authorizing Provider  albuterol (PROVENTIL HFA;VENTOLIN HFA) 108 (90 Base) MCG/ACT inhaler INHALE 1-2 PUFFS INTO THE LUNGS EVERY 4 (FOUR) HOURS AS NEEDED FOR WHEEZING OR SHORTNESS OF BREATH. 07/16/18  Yes [name], MD  fluticasone (FLONASE) 50 MCG/ACT nasal spray Place 1 spray into both nostrils daily.   Yes [provider]  ibuprofen (ADVIL) 800 MG tablet Take 1 tablet (800 mg total) by mouth every 8 (eight) hours as needed. Patient not taking: Reported on 08/03/2022 01/27/22   Lorenda Peck, DPM  LORazepam (ATIVAN) 1 MG tablet Take 1 tablet (1 mg total) by mouth 2 (two) times daily as needed for anxiety. 06/16/18  Yes [name], MD  OVER THE COUNTER MEDICATION Relaxium- for sleep 14 day trial   Yes [provider]  OVER THE COUNTER MEDICATION Hydrosense- for nose saline solution   Yes [provider]  temazepam (RESTORIL) 15 MG capsule Take 15-30 mg by mouth at bedtime as needed. 07/16/22  Yes [provider]  acetaminophen-codeine (TYLENOL #3) 300-30 MG tablet SMARTSIG:1-2 Tablet(s) By Mouth 2-3 Times Daily PRN 01/16/20   [provider]  ciprofloxacin (CIPRO) 500 MG tablet Take 1 tablet (500 mg total) by mouth every 12 (twelve) hours. Patient not taking: Reported on 08/03/2022 07/31/22   Pattricia Boss, MD  metroNIDAZOLE (FLAGYL) 500 MG tablet Take 1 tablet (500 mg total) by mouth 2 (two) times daily. Patient not taking:  Reported on 08/03/2022 07/31/22   Pattricia Boss, MD  neomycin-colistin-hydrocortisone-thonzonium (CORTISPORIN-TC) 3.02-18-09-0.5 MG/ML OTIC suspension Apply 2 drops to ingrown toenail removal site twice daily after soaking. Patient not taking: Reported on 08/03/2022 01/28/22   Lorenda Peck, DPM    Physical Exam: Vitals:   08/03/22 0700 08/03/22 0810 08/03/22 1121 08/03/22 1238  BP: 110/74 107/81 107/82   Pulse: 88 86 85   Resp: '16 15 16   '$ Temp:  98.4 F (36.9 C)  98.4 F (36.9 C)  TempSrc:  Oral     SpO2: 90% 98% 96%    General: 61 y.o. female resting in bed in NAD Eyes: PERRL, normal sclera ENMT: Nares patent w/o discharge, orophaynx clear, dentition normal, ears w/o discharge/lesions/ulcers Neck: Supple, trachea midline Cardiovascular: RRR, +S1, S2, no m/g/r, equal pulses throughout Respiratory: CTABL, no w/r/r, normal WOB GI: BS+, ND, mild TTP RUQ/LUQ, no masses noted, no organomegaly noted MSK: No e/c/c Neuro: A&O x 3, no focal deficits Psyc: Appropriate interaction and affect, calm/cooperative  Data Reviewed:  Lab Results  Component Value Date   NA 130 (L) 08/03/2022   K 2.9 (L) 08/03/2022   CO2 20 (L) 08/03/2022   GLUCOSE 158 (H) 08/03/2022   BUN 6 08/03/2022   CREATININE 1.09 (H) 08/03/2022   CALCIUM 10.3 08/03/2022   GFRNONAA 59 (L) 08/03/2022   Lab Results  Component Value Date   WBC 13.8 (H) 08/03/2022   HGB 17.2 (H) 08/03/2022   HCT 45.3 08/03/2022   MCV 90.2 08/03/2022   PLT 262 08/03/2022   CT ab/pelvis w/ Changes of very mild diverticulitis in the sigmoid colon without perforation or abscess formation. This is new from the prior exam. No evidence of pancreatitis. Dilatation of the common bile duct 12 mm although no choledocholithiasis is seen. Very mild intrahepatic ductal dilatation is seen as well.  Assessment and Plan: Diverticulitis Diarrhea     - admitted to inpt, tele     - CT as above     - c diff is negative on 8/13; check GI PCR     - continue current abx     - fluids, anti-emetics, pain control     - NPO until imaging complete  Elevated LFTs Hyperbilirubinemia ?Biliary obstruction     - CT as above     - check [place] RUQ, hepatitis panel, MRCP     - [facility] consulted ([facility] Dr. Watt Climes to see  Hypokalemia Hyponatremia     - check Mg2+; replace K+     - Na+ is mildly low; continue fluids, follow  Hyperglycemia     - check A1c  GAD     - continue home regimen when confirmed and off NPO status  Tobacco abuse      - counsel against further use  Advance Care Planning:   Code Status: DNR, confirmed through multiple lines of questioning  Consults: [facility]  Family Communication: None at bedside  Severity of Illness: The appropriate patient status for this patient is INPATIENT. Inpatient status is judged to be reasonable and necessary in order to provide the required intensity of service to ensure the patient's safety. The patient's presenting symptoms, physical exam findings, and initial radiographic and laboratory data in the context of their chronic comorbidities is felt to place them at high risk for further clinical deterioration. Furthermore, it is not anticipated that the patient will be medically stable for discharge from the hospital within 2 midnights of admission.   *  I certify that at the point of admission it is my clinical judgment that the patient will require inpatient hospital care spanning beyond 2 midnights from the point of admission due to high intensity of service, high risk for further deterioration and high frequency of surveillance required.*  Author: Jonnie Finner, DO 08/03/2022 3:17 PM  For on call review [email or web address].

## 2022-08-03 NOTE — ED Notes (Signed)
Pt. Informs [place] that she must "go out for a moment to get something from her car". I inform her that we will allow her back in a moment.

## 2022-08-03 NOTE — ED Notes (Signed)
She leaves with [facility] now. Pain med. Given just prior to transport.

## 2022-08-03 NOTE — ED Notes (Signed)
Pt attempted to obtain urine specimen without success.

## 2022-08-03 NOTE — ED Provider Notes (Signed)
[facility] Provider Note  CSN: [id number] Arrival date & time: 08/03/22 0046  Chief Complaint(s) Diarrhea  HPI Rachel Weber is a 61 y.o. female with a past medical history listed below who was seen 3 days ago for 1 week of diarrhea.  Her work-up at that time was notable for evidence of enterocolitis on CT scan.  She was prescribed Cipro and Flagyl which she has been taking since.  She returns today due to persistent and worsening symptoms including worsening abdominal pain mostly in the left lower quadrant with nausea and nonbloody nonbilious emesis.  Patient has had decreased oral tolerance, but has been able to keep her medication down.  Denies any other physical complaints  The history is provided by the patient.    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 Authorizing Provider  ibuprofen (ADVIL) 800 MG tablet Take 1 tablet (800 mg total) by mouth every 8 (eight) hours as needed. 01/27/22   Lorenda Peck, DPM  acetaminophen-codeine (TYLENOL #3) 300-30 MG tablet SMARTSIG:1-2 Tablet(s) By Mouth 2-3 Times Daily PRN 01/16/20   [provider]  albuterol (PROVENTIL HFA;VENTOLIN HFA) 108 (90 Base) MCG/ACT inhaler INHALE 1-2 PUFFS INTO THE LUNGS EVERY 4 (FOUR) HOURS AS NEEDED FOR WHEEZING OR SHORTNESS OF BREATH. 07/16/18   [name], MD  ciprofloxacin (CIPRO) 500 MG tablet Take 1 tablet (500 mg total) by mouth every 12 (twelve) hours. 07/31/22   Pattricia Boss, MD  diphenhydrAMINE (BANOPHEN) 50 MG capsule TAKE 1 CAPSULE BY MOUTH EVERYDAY AT BEDTIME  01/08/20   [provider]  LORazepam (ATIVAN) 1 MG tablet Take 1 tablet (1 mg total) by mouth 2 (two) times daily as needed for anxiety. 06/16/18   [name], MD  metroNIDAZOLE (FLAGYL) 500 MG tablet Take 1 tablet (500 mg total) by mouth 2 (two) times daily. 07/31/22   Pattricia Boss, MD  neomycin-colistin-hydrocortisone-thonzonium (CORTISPORIN-TC) 3.02-18-09-0.5 MG/ML OTIC suspension Apply 2 drops to ingrown toenail removal site twice daily after soaking. 01/28/22   Lorenda Peck, DPM                                                                                                                                    Allergies Oxycodone  Review of Systems Review of Systems As noted in HPI  Physical Exam Vital Signs  I have reviewed the triage vital signs BP 120/85   Pulse 86   Temp (!) 97.3 F (36.3 C)   Resp 12   SpO2 95%  Physical Exam Vitals reviewed.  Constitutional:      General: She is not in acute distress.    Appearance: She is well-developed. She is not diaphoretic.  HENT:     Head: Normocephalic and atraumatic.     Right Ear: External ear normal.     Left Ear: External ear normal.     Nose: Nose normal.  Eyes:     General: No scleral icterus.    Conjunctiva/sclera: Conjunctivae normal.  Neck:     Trachea: Phonation normal.  Cardiovascular:     Rate and Rhythm: Normal rate and regular rhythm.  Pulmonary:     Effort: Pulmonary effort is normal. No respiratory distress.     Breath sounds: No stridor.  Abdominal:     General: There is no distension.     Tenderness: There is abdominal tenderness in the left lower quadrant. There is no guarding or rebound.  Musculoskeletal:        General: Normal range of motion.     Cervical back: Normal range of motion.  Neurological:     Mental Status: She is alert and oriented to person, place, and time.  Psychiatric:        Behavior: Behavior normal.     ED Results and Treatments Labs (all labs ordered are  listed, but only abnormal results are displayed) Labs Reviewed  LIPASE, BLOOD - Abnormal; Notable for the following components:      Result Value   Lipase 385 (*)    All other components within normal limits  COMPREHENSIVE METABOLIC PANEL - Abnormal; Notable for the following components:   Sodium 130 (*)    Potassium 2.9 (*)    Chloride 94 (*)    CO2 20 (*)    Glucose, Bld 158 (*)    Creatinine, Ser 1.09 (*)    AST 117 (*)    ALT 111 (*)    Alkaline Phosphatase 252 (*)    Total Bilirubin 3.9 (*)    GFR, Estimated 59 (*)    Anion gap 16 (*)    All other components within normal limits  CBC - Abnormal; Notable for the following components:   WBC 13.8 (*)    Hemoglobin 17.2 (*)    MCH 34.3 (*)    MCHC 38.0 (*)    All other components within normal limits  CBG MONITORING, ED - Abnormal; Notable for the following components:   Glucose-Capillary 175 (*)    All other components within normal limits  URINALYSIS, ROUTINE W REFLEX MICROSCOPIC                                                                                                                         EKG  EKG Interpretation  Date/Time:    Ventricular Rate:    PR Interval:    QRS Duration:   QT Interval:    QTC Calculation:   R Axis:     Text Interpretation:  Radiology CT ABDOMEN PELVIS W CONTRAST  Result Date: 08/03/2022 CLINICAL DATA:  Abdominal pain EXAM: CT ABDOMEN AND PELVIS WITH CONTRAST TECHNIQUE: Multidetector CT imaging of the abdomen and pelvis was performed using the standard protocol following bolus administration of intravenous contrast. RADIATION DOSE REDUCTION: This exam was performed according to the departmental dose-optimization program which includes automated exposure control, adjustment of the mA and/or kV according to patient size and/or use of iterative reconstruction technique. CONTRAST:  32m OMNIPAQUE IOHEXOL 300 MG/ML  SOLN COMPARISON:  07/31/2022 FINDINGS: Lower chest: Mild  atelectasis is noted in the right lung base. Hepatobiliary: Gallbladder is well distended. Liver is well visualized without focal mass. Intrahepatic biliary ductal dilatation is seen new from the prior exam. The common bile duct is prominent measuring up to 12 mm. No definitive choledocholithiasis is seen. Pancreas: Unremarkable. No pancreatic ductal dilatation or surrounding inflammatory changes. Spleen: Normal in size without focal abnormality. Adrenals/Urinary Tract: Adrenal glands are within normal limits. Kidneys demonstrate a normal enhancement pattern. No renal calculi or obstructive changes are noted. Normal excretion is noted on delayed images. Ureters are within normal limits. The bladder is unremarkable. Stomach/Bowel: The appendix has been surgically removed. Changes of mild diverticulitis are seen in the sigmoid colon. No perforation or abscess formation is noted. These changes are new from the prior exam. Small bowel and stomach are unremarkable with the exception of a small sliding-type hiatal hernia. Vascular/Lymphatic: Aortic atherosclerosis. No enlarged abdominal or pelvic lymph nodes. Reproductive: Uterus and bilateral adnexa are unremarkable. Other: No abdominal wall hernia or abnormality. No abdominopelvic ascites. Musculoskeletal: No acute or significant osseous findings. IMPRESSION: Changes of very mild diverticulitis in the sigmoid colon without perforation or abscess formation. This is new from the prior exam. No evidence of pancreatitis. Dilatation of the common bile duct 12 mm although no choledocholithiasis is seen. Very mild intrahepatic ductal dilatation is seen as well. Electronically Signed   By: MInez CatalinaM.D.   On: 08/03/2022 03:34    Medications Ordered in ED Medications  sodium chloride 0.9 % bolus 1,000 mL (0 mLs Intravenous Stopped 08/03/22 0400)    Followed by  0.9 %  sodium chloride infusion (1,000 mLs Intravenous New Bag/Given 08/03/22 0420)  potassium chloride 10 mEq  in 100 mL IVPB (10 mEq Intravenous New Bag/Given 08/03/22 0408)  fentaNYL (SUBLIMAZE) injection 50 mcg (50 mcg Intravenous Given 08/03/22 0301)  ondansetron (ZOFRAN) injection 4 mg (4 mg Intravenous Given 08/03/22 0301)  iohexol (OMNIPAQUE) 300 MG/ML solution 80 mL (80 mLs Intravenous Contrast Given 08/03/22 0311)  HYDROmorphone (DILAUDID) injection 0.5 mg (0.5 mg Intravenous Given 08/03/22 0405)  piperacillin-tazobactam (ZOSYN) IVPB 3.375 g (0 g Intravenous Stopped 08/03/22 0441)                                                                                                                                     Procedures .1-3 Lead EKG Interpretation  Performed by:  Shae Hinnenkamp, Grayce Sessions, MD Authorized by: Fatima Blank, MD     Interpretation: normal     ECG rate:  99   ECG rate assessment: normal     Rhythm: sinus rhythm     Ectopy: none     Conduction: normal     (including critical care time)  Medical Decision Making / ED Course   Medical Decision Making Amount and/or Complexity of Data Reviewed External Data Reviewed: labs and radiology.    Details: recenta labs and imaging discussed above Labs: ordered. Decision-making details documented in ED Course. Radiology: ordered and independent interpretation performed. Decision-making details documented in ED Course.  Risk Prescription drug management. Parenteral controlled substances. Decision regarding hospitalization.    Worsening abdominal pain now with left lower quadrant tenderness.  Persistent diarrhea with nausea and vomiting. Will obtain labs to assess for any new/worsening changes to include electrolyte derangements, renal insufficiency.  Depending on labs, may consider repeating the CT scan.  CBC with leukocytosis up from 3 days ago.  There is evidence of hemoconcentration. Metabolic panel with mild hyponatremia/hypochloremia, hypokalemia.  Patient has hyperglycemia without evidence of DKA.  Elevated LFTs and  lipase concerning for biliary obstruction.    CT scan was notable for dilated biliary ducts.  No obvious stones.  No evidence of pancreatitis or acute cholecystitis.  Patient does not have any right upper quadrant or epigastric abdominal tenderness concerning for these.  CT scan did also reveal evidence of diverticulitis which is consistent with her exam.  Patient was provided with IV fluids, antiemetics, IV pain medicine Was started on Zosyn.  Discussed the case with Dr. Velia Meyer, from the hospitalist service who agreed to admit patient for further work-up and management       Final Clinical Impression(s) / ED Diagnoses Final diagnoses:  Diverticulitis  Biliary obstruction           This chart was dictated using voice recognition software.  Despite best efforts to proofread,  errors can occur which can change the documentation meaning.    Fatima Blank, MD 08/03/22 [phone]

## 2022-08-04 DIAGNOSIS — R197 Diarrhea, unspecified: Secondary | ICD-10-CM

## 2022-08-04 DIAGNOSIS — K5792 Diverticulitis of intestine, part unspecified, without perforation or abscess without bleeding: Secondary | ICD-10-CM | POA: Diagnosis not present

## 2022-08-04 LAB — GASTROINTESTINAL PANEL BY PCR, STOOL (REPLACES STOOL CULTURE)

## 2022-08-04 LAB — COMPREHENSIVE METABOLIC PANEL
ALT: 75 U/L — ABNORMAL HIGH (ref 0–44)
AST: 75 U/L — ABNORMAL HIGH (ref 15–41)
Albumin: 3.8 g/dL (ref 3.5–5.0)
Alkaline Phosphatase: 195 U/L — ABNORMAL HIGH (ref 38–126)
Anion gap: 7 (ref 5–15)
BUN: 6 mg/dL (ref 6–20)
CO2: 23 mmol/L (ref 22–32)
Calcium: 8.9 mg/dL (ref 8.9–10.3)
Chloride: 107 mmol/L (ref 98–111)
Creatinine, Ser: 0.64 mg/dL (ref 0.44–1.00)
GFR, Estimated: >60 mL/min (ref >60)
Glucose, Bld: 88 mg/dL (ref 70–99)
Potassium: 2.8 mmol/L — ABNORMAL LOW (ref 3.5–5.1)
Sodium: 137 mmol/L (ref 135–145)
Total Bilirubin: 2.2 mg/dL — ABNORMAL HIGH (ref 0.3–1.2)
Total Protein: 6.3 g/dL — ABNORMAL LOW (ref 6.5–8.1)

## 2022-08-04 LAB — CBC
HCT: 39.2 % (ref 36.0–46.0)
Hemoglobin: 14.4 g/dL (ref 12.0–15.0)
MCH: 35.4 pg — ABNORMAL HIGH (ref 26.0–34.0)
MCHC: 36.7 g/dL — ABNORMAL HIGH (ref 30.0–36.0)
MCV: 96.3 fL (ref 80.0–100.0)
Platelets: 197 10*3/uL (ref 150–400)
RBC: 4.07 MIL/uL (ref 3.87–5.11)
RDW: 12.3 % (ref 11.5–15.5)
WBC: 7.8 10*3/uL (ref 4.0–10.5)
nRBC: 0 % (ref 0.0–0.2)

## 2022-08-04 MED ORDER — POTASSIUM CHLORIDE CRYS ER 20 MEQ PO TBCR
40.0000 meq | EXTENDED_RELEASE_TABLET | Freq: Once | ORAL | Status: AC
Start: 2022-08-04 — End: 2022-08-04
  Administered 2022-08-04: 40 meq via ORAL
  Filled 2022-08-04: qty 2

## 2022-08-04 NOTE — Discharge Summary (Addendum)
Left AMA  61 year old female with a history of GAD, tobacco abuse presented with abdominal pain and diarrhea.  Patient was treated with Cipro and Flagyl as outpatient for enterocolitis however she could not tolerate.  Patient came to the ED for further evaluation.    In the ED CT scan abdomen/pelvis showed mild diverticulitis, patient was started on IV Zosyn. Patient was seen by gastroenterology, as she had elevated liver enzymes.  MRCP was ordered and there was plan for ERCP. This morning patient potassium was low at 2.8, she will continue to have diarrhea last night.  I explained to the patient that medically she is not ready for discharge due to ongoing diarrhea, dangerously low potassium which can induce arrhythmias, and also incomplete GI work-up.  Patient says that she has to leave AMA as her mother is sick.  I gave patient 1 dose of K-Dur 40 mEq p.o. x1 before leaving AMA.  I explained to patient that if she does not feel better she needs to be seen in the emergency room, as living like this can be life-threatening.  Patient understands the risks of leaving AMA and decided to leave against my advice.

## 2022-08-04 NOTE — Progress Notes (Signed)
This nurse called into room.  Patient stated that she needed to leave.  She wanted to speak with the doctor.  Dr. Darrick Meigs notified.  Dr. Darrick Meigs arrived on unit and spoke with patient.  Patient still wanted to leave.  IVs removed and AMA paperwork signed.    Virginia Rochester, RN

## 2022-08-05 ENCOUNTER — Other Ambulatory Visit: Payer: Self-pay

## 2022-08-05 ENCOUNTER — Emergency Department (HOSPITAL_COMMUNITY)
Admission: EM | Admit: 2022-08-05 | Discharge: 2022-08-05 | Discharge status: Against Medical Advice | Payer: BC Managed Care – PPO | Attending: Emergency Medicine | Admitting: Emergency Medicine

## 2022-08-05 ENCOUNTER — Encounter (HOSPITAL_COMMUNITY): Payer: Self-pay

## 2022-08-05 DIAGNOSIS — Z5321 Procedure and treatment not carried out due to patient leaving prior to being seen by health care provider: Secondary | ICD-10-CM | POA: Diagnosis not present

## 2022-08-05 DIAGNOSIS — R634 Abnormal weight loss: Secondary | ICD-10-CM | POA: Insufficient documentation

## 2022-08-05 DIAGNOSIS — R140 Abdominal distension (gaseous): Secondary | ICD-10-CM | POA: Insufficient documentation

## 2022-08-05 DIAGNOSIS — R197 Diarrhea, unspecified: Secondary | ICD-10-CM | POA: Diagnosis not present

## 2022-08-05 DIAGNOSIS — E876 Hypokalemia: Secondary | ICD-10-CM | POA: Diagnosis present

## 2022-08-05 LAB — COMPREHENSIVE METABOLIC PANEL
ALT: 71 U/L — ABNORMAL HIGH (ref 0–44)
AST: 59 U/L — ABNORMAL HIGH (ref 15–41)
Albumin: 3.8 g/dL (ref 3.5–5.0)
Alkaline Phosphatase: 242 U/L — ABNORMAL HIGH (ref 38–126)
Anion gap: 11 (ref 5–15)
BUN: <5 mg/dL — ABNORMAL LOW (ref 6–20)
CO2: 21 mmol/L — ABNORMAL LOW (ref 22–32)
Calcium: 9.4 mg/dL (ref 8.9–10.3)
Chloride: 106 mmol/L (ref 98–111)
Creatinine, Ser: 0.63 mg/dL (ref 0.44–1.00)
GFR, Estimated: >60 mL/min (ref >60)
Glucose, Bld: 112 mg/dL — ABNORMAL HIGH (ref 70–99)
Potassium: 3 mmol/L — ABNORMAL LOW (ref 3.5–5.1)
Sodium: 138 mmol/L (ref 135–145)
Total Bilirubin: 1.4 mg/dL — ABNORMAL HIGH (ref 0.3–1.2)
Total Protein: 6.5 g/dL (ref 6.5–8.1)

## 2022-08-05 LAB — URINALYSIS, ROUTINE W REFLEX MICROSCOPIC
Bilirubin Urine: NEGATIVE
Glucose, UA: NEGATIVE mg/dL
Hgb urine dipstick: NEGATIVE
Ketones, ur: NEGATIVE mg/dL
Nitrite: NEGATIVE
Protein, ur: 30 mg/dL — AB
Specific Gravity, Urine: 1.018 (ref 1.005–1.030)
pH: 5 (ref 5.0–8.0)

## 2022-08-05 LAB — CBC
HCT: 42.5 % (ref 36.0–46.0)
Hemoglobin: 15.5 g/dL — ABNORMAL HIGH (ref 12.0–15.0)
MCH: 34.4 pg — ABNORMAL HIGH (ref 26.0–34.0)
MCHC: 36.5 g/dL — ABNORMAL HIGH (ref 30.0–36.0)
MCV: 94.4 fL (ref 80.0–100.0)
Platelets: 272 10*3/uL (ref 150–400)
RBC: 4.5 MIL/uL (ref 3.87–5.11)
RDW: 12.3 % (ref 11.5–15.5)
WBC: 7.3 10*3/uL (ref 4.0–10.5)
nRBC: 0 % (ref 0.0–0.2)

## 2022-08-05 LAB — LIPASE, BLOOD: Lipase: 152 U/L — ABNORMAL HIGH (ref 11–51)

## 2022-08-05 NOTE — ED Notes (Signed)
Called x4 for vitals and no response

## 2022-08-05 NOTE — ED Triage Notes (Signed)
Patient was admitted to [facility] 2 days ago and left AMA bc her mother is dying in [place] and thought she would have to fly there bc she is HCPOA.  Patient back bc she has low K and diarrrhea.  Reports she was suppose to have an MRI and they had ruled  out pancreatitis, alcohol related issues.  Complains of 15lbs weight loss and abd distention.

## 2022-08-08 ENCOUNTER — Encounter (HOSPITAL_BASED_OUTPATIENT_CLINIC_OR_DEPARTMENT_OTHER): Payer: Self-pay | Admitting: Radiology

## 2022-08-08 ENCOUNTER — Other Ambulatory Visit: Payer: Self-pay

## 2022-08-08 ENCOUNTER — Encounter (HOSPITAL_COMMUNITY): Payer: Self-pay

## 2022-08-08 ENCOUNTER — Inpatient Hospital Stay (HOSPITAL_BASED_OUTPATIENT_CLINIC_OR_DEPARTMENT_OTHER)
Admission: EM | Admit: 2022-08-08 | Discharge: 2022-08-10 | DRG: 391 | Disposition: A | Discharge status: Home / Self Care | Payer: BC Managed Care – PPO | Attending: Internal Medicine | Admitting: Internal Medicine

## 2022-08-08 ENCOUNTER — Emergency Department (HOSPITAL_BASED_OUTPATIENT_CLINIC_OR_DEPARTMENT_OTHER): Payer: BC Managed Care – PPO

## 2022-08-08 DIAGNOSIS — E876 Hypokalemia: Secondary | ICD-10-CM | POA: Diagnosis present

## 2022-08-08 DIAGNOSIS — T373X5A Adverse effect of other antiprotozoal drugs, initial encounter: Secondary | ICD-10-CM | POA: Diagnosis present

## 2022-08-08 DIAGNOSIS — Z66 Do not resuscitate: Secondary | ICD-10-CM | POA: Diagnosis present

## 2022-08-08 DIAGNOSIS — F17210 Nicotine dependence, cigarettes, uncomplicated: Secondary | ICD-10-CM | POA: Diagnosis present

## 2022-08-08 DIAGNOSIS — T3695XA Adverse effect of unspecified systemic antibiotic, initial encounter: Secondary | ICD-10-CM | POA: Diagnosis not present

## 2022-08-08 DIAGNOSIS — E871 Hypo-osmolality and hyponatremia: Secondary | ICD-10-CM | POA: Diagnosis present

## 2022-08-08 DIAGNOSIS — K831 Obstruction of bile duct: Secondary | ICD-10-CM | POA: Diagnosis present

## 2022-08-08 DIAGNOSIS — K5792 Diverticulitis of intestine, part unspecified, without perforation or abscess without bleeding: Secondary | ICD-10-CM | POA: Diagnosis not present

## 2022-08-08 DIAGNOSIS — F411 Generalized anxiety disorder: Secondary | ICD-10-CM | POA: Diagnosis present

## 2022-08-08 DIAGNOSIS — Y92230 Patient room in hospital as the place of occurrence of the external cause: Secondary | ICD-10-CM | POA: Diagnosis present

## 2022-08-08 DIAGNOSIS — N179 Acute kidney failure, unspecified: Secondary | ICD-10-CM | POA: Diagnosis present

## 2022-08-08 DIAGNOSIS — Z881 Allergy status to other antibiotic agents status: Secondary | ICD-10-CM

## 2022-08-08 DIAGNOSIS — K838 Other specified diseases of biliary tract: Secondary | ICD-10-CM

## 2022-08-08 DIAGNOSIS — R112 Nausea with vomiting, unspecified: Secondary | ICD-10-CM | POA: Diagnosis present

## 2022-08-08 DIAGNOSIS — F39 Unspecified mood [affective] disorder: Secondary | ICD-10-CM | POA: Diagnosis present

## 2022-08-08 DIAGNOSIS — R7989 Other specified abnormal findings of blood chemistry: Secondary | ICD-10-CM

## 2022-08-08 DIAGNOSIS — R6510 Systemic inflammatory response syndrome (SIRS) of non-infectious origin without acute organ dysfunction: Secondary | ICD-10-CM

## 2022-08-08 DIAGNOSIS — K521 Toxic gastroenteritis and colitis: Secondary | ICD-10-CM | POA: Diagnosis not present

## 2022-08-08 DIAGNOSIS — Z885 Allergy status to narcotic agent status: Secondary | ICD-10-CM

## 2022-08-08 DIAGNOSIS — Z79899 Other long term (current) drug therapy: Secondary | ICD-10-CM

## 2022-08-08 LAB — CK: Total CK: 46 U/L (ref 38–234)

## 2022-08-08 LAB — CBC
HCT: 48.5 % — ABNORMAL HIGH (ref 36.0–46.0)
Hemoglobin: 17.9 g/dL — ABNORMAL HIGH (ref 12.0–15.0)
MCH: 33.8 pg (ref 26.0–34.0)
MCHC: 36.9 g/dL — ABNORMAL HIGH (ref 30.0–36.0)
MCV: 91.5 fL (ref 80.0–100.0)
Platelets: 390 10*3/uL (ref 150–400)
RBC: 5.3 MIL/uL — ABNORMAL HIGH (ref 3.87–5.11)
RDW: 12 % (ref 11.5–15.5)
WBC: 11.7 10*3/uL — ABNORMAL HIGH (ref 4.0–10.5)
nRBC: 0 % (ref 0.0–0.2)

## 2022-08-08 LAB — COMPREHENSIVE METABOLIC PANEL
ALT: 51 U/L — ABNORMAL HIGH (ref 0–44)
AST: 52 U/L — ABNORMAL HIGH (ref 15–41)
Albumin: 5.1 g/dL — ABNORMAL HIGH (ref 3.5–5.0)
Alkaline Phosphatase: 429 U/L — ABNORMAL HIGH (ref 38–126)
Anion gap: 17 — ABNORMAL HIGH (ref 5–15)
BUN: 13 mg/dL (ref 6–20)
CO2: 17 mmol/L — ABNORMAL LOW (ref 22–32)
Calcium: 10.4 mg/dL — ABNORMAL HIGH (ref 8.9–10.3)
Chloride: 99 mmol/L (ref 98–111)
Creatinine, Ser: 1.45 mg/dL — ABNORMAL HIGH (ref 0.44–1.00)
GFR, Estimated: 42 mL/min — ABNORMAL LOW (ref >60)
Glucose, Bld: 124 mg/dL — ABNORMAL HIGH (ref 70–99)
Potassium: 3.3 mmol/L — ABNORMAL LOW (ref 3.5–5.1)
Sodium: 133 mmol/L — ABNORMAL LOW (ref 135–145)
Total Bilirubin: 1.4 mg/dL — ABNORMAL HIGH (ref 0.3–1.2)
Total Protein: 8.5 g/dL — ABNORMAL HIGH (ref 6.5–8.1)

## 2022-08-08 LAB — URINALYSIS, ROUTINE W REFLEX MICROSCOPIC
Bilirubin Urine: NEGATIVE
Glucose, UA: NEGATIVE mg/dL
Hgb urine dipstick: NEGATIVE
Ketones, ur: NEGATIVE mg/dL
Leukocytes,Ua: NEGATIVE
Nitrite: NEGATIVE
Specific Gravity, Urine: 1.046 — ABNORMAL HIGH (ref 1.005–1.030)
pH: 6 (ref 5.0–8.0)

## 2022-08-08 LAB — MAGNESIUM: Magnesium: 1.8 mg/dL (ref 1.7–2.4)

## 2022-08-08 LAB — LACTIC ACID, PLASMA: Lactic Acid, Venous: 1.1 mmol/L (ref 0.5–1.9)

## 2022-08-08 LAB — BASIC METABOLIC PANEL
Anion gap: 10 (ref 5–15)
BUN: 11 mg/dL (ref 6–20)
CO2: 21 mmol/L — ABNORMAL LOW (ref 22–32)
Calcium: 8.8 mg/dL — ABNORMAL LOW (ref 8.9–10.3)
Chloride: 102 mmol/L (ref 98–111)
Creatinine, Ser: 1 mg/dL (ref 0.44–1.00)
GFR, Estimated: >60 mL/min (ref >60)
Glucose, Bld: 115 mg/dL — ABNORMAL HIGH (ref 70–99)
Potassium: 3.1 mmol/L — ABNORMAL LOW (ref 3.5–5.1)
Sodium: 133 mmol/L — ABNORMAL LOW (ref 135–145)

## 2022-08-08 LAB — LIPASE, BLOOD: Lipase: 553 U/L — ABNORMAL HIGH (ref 11–51)

## 2022-08-08 LAB — CREATININE, URINE, RANDOM: Creatinine, Urine: 90 mg/dL

## 2022-08-08 LAB — SODIUM, URINE, RANDOM: Sodium, Ur: <10 mmol/L

## 2022-08-08 MED ORDER — PIPERACILLIN-TAZOBACTAM 3.375 G IVPB 30 MIN
3.3750 g | Freq: Once | INTRAVENOUS | Status: AC
Start: 2022-08-08 — End: 2022-08-08
  Administered 2022-08-08: 3.375 g via INTRAVENOUS
  Filled 2022-08-08: qty 50

## 2022-08-08 MED ORDER — ONDANSETRON HCL 4 MG/2ML IJ SOLN
4.0000 mg | Freq: Four times a day (QID) | INTRAMUSCULAR | Status: DC | PRN
  Administered 2022-08-08: 4 mg via INTRAVENOUS
  Filled 2022-08-08: qty 2

## 2022-08-08 MED ORDER — POTASSIUM CHLORIDE CRYS ER 20 MEQ PO TBCR
40.0000 meq | EXTENDED_RELEASE_TABLET | Freq: Once | ORAL | Status: AC
  Administered 2022-08-09: 40 meq via ORAL
  Filled 2022-08-08: qty 2

## 2022-08-08 MED ORDER — ONDANSETRON HCL 4 MG/2ML IJ SOLN
4.0000 mg | Freq: Once | INTRAMUSCULAR | Status: AC
  Administered 2022-08-08: 4 mg via INTRAVENOUS
  Filled 2022-08-08: qty 2

## 2022-08-08 MED ORDER — SODIUM CHLORIDE 0.9 % IV BOLUS
1000.0000 mL | Freq: Once | INTRAVENOUS | Status: AC
  Administered 2022-08-08: 1000 mL via INTRAVENOUS

## 2022-08-08 MED ORDER — PIPERACILLIN-TAZOBACTAM 3.375 G IVPB
3.3750 g | Freq: Three times a day (TID) | INTRAVENOUS | Status: DC
  Administered 2022-08-09 – 2022-08-10 (×4): 3.375 g via INTRAVENOUS
  Filled 2022-08-08 (×4): qty 50

## 2022-08-08 MED ORDER — MELATONIN 3 MG PO TABS
3.0000 mg | ORAL_TABLET | Freq: Every day | ORAL | Status: DC
  Filled 2022-08-08: qty 1

## 2022-08-08 MED ORDER — DIPHENHYDRAMINE HCL 25 MG PO CAPS
25.0000 mg | ORAL_CAPSULE | Freq: Once | ORAL | Status: AC
  Administered 2022-08-08: 25 mg via ORAL
  Filled 2022-08-08: qty 1

## 2022-08-08 MED ORDER — HYDROMORPHONE HCL 1 MG/ML IJ SOLN
0.5000 mg | Freq: Once | INTRAMUSCULAR | Status: AC
  Administered 2022-08-08: 0.5 mg via INTRAVENOUS
  Filled 2022-08-08: qty 1

## 2022-08-08 MED ORDER — POTASSIUM CHLORIDE 10 MEQ/100ML IV SOLN
10.0000 meq | INTRAVENOUS | Status: AC
  Administered 2022-08-08 (×2): 10 meq via INTRAVENOUS
  Filled 2022-08-08: qty 100

## 2022-08-08 MED ORDER — IOHEXOL 300 MG/ML  SOLN
100.0000 mL | Freq: Once | INTRAMUSCULAR | Status: AC | PRN
  Administered 2022-08-08: 80 mL via INTRAVENOUS

## 2022-08-08 MED ORDER — HYDROMORPHONE HCL 1 MG/ML IJ SOLN
0.5000 mg | Freq: Four times a day (QID) | INTRAMUSCULAR | Status: DC | PRN
  Administered 2022-08-08 – 2022-08-10 (×7): 0.5 mg via INTRAVENOUS
  Filled 2022-08-08 (×7): qty 1

## 2022-08-08 MED ORDER — SODIUM CHLORIDE 0.9 % IV SOLN: INTRAVENOUS | Status: DC | PRN

## 2022-08-08 NOTE — ED Provider Notes (Signed)
[facility] EMERGENCY DEPT Provider Note   CSN: [id number] Arrival date & time: 08/08/22  1236    History  Chief Complaint  Patient presents with   Abdominal Pain   Diarrhea   Nausea    Rachel Weber is a 61 y.o. female history of diverticulitis, biliary obstruction here for evaluation of abdominal pain.  Admitted 08/03/2022.  Was seen by [facility] at that time and was to have an MRCP/possible ERCP due to elevated LFTs with dilated common bile duct stone on CT.  She is also getting antibiotics for diverticulitis, and electrolyte replacement. Patient states she left AGAINST MEDICAL ADVICE.  She was not discharged home with antibiotics.  Patient states she continues to feel unwell.  She is having persistent watery yellow diarrhea.  She has had worsening lower abdominal pain and bloating.  She also still has some epigastric pain.  Multiple episodes of nonbloody emesis.  States she has had chills, felt febrile without documented fever.  When she goes from sitting to standing she feels lightheaded and she feels dehydrated.  She denies any chronic NSAID use, EtOH use.  No HA, CP, SOB, back pain, unilateral weakness, swelling in extremities.  HPI     Home Medications Prior to Admission medications   Medication Sig Start Date End Date Taking? Authorizing Provider  ibuprofen (ADVIL) 800 MG tablet Take 1 tablet (800 mg total) by mouth every 8 (eight) hours as needed. Patient not taking: Reported on 08/03/2022 01/27/22   Lorenda Peck, DPM  acetaminophen-codeine (TYLENOL #3) 300-30 MG tablet SMARTSIG:1-2 Tablet(s) By Mouth 2-3 Times Daily PRN 01/16/20   [provider]  albuterol (PROVENTIL HFA;VENTOLIN HFA) 108 (90 Base) MCG/ACT inhaler INHALE 1-2 PUFFS INTO THE LUNGS EVERY 4 (FOUR) HOURS AS NEEDED FOR WHEEZING OR SHORTNESS OF BREATH. 07/16/18   [name], MD  ciprofloxacin (CIPRO) 500 MG tablet Take 1 tablet (500 mg total) by mouth every 12 (twelve) hours. Patient  not taking: Reported on 08/03/2022 07/31/22   Pattricia Boss, MD  fluticasone [facility]) 50 MCG/ACT nasal spray Place 1 spray into both nostrils daily.    [provider]  LORazepam (ATIVAN) 1 MG tablet Take 1 tablet (1 mg total) by mouth 2 (two) times daily as needed for anxiety. 06/16/18   [name], MD  metroNIDAZOLE (FLAGYL) 500 MG tablet Take 1 tablet (500 mg total) by mouth 2 (two) times daily. Patient not taking: Reported on 08/03/2022 07/31/22   Pattricia Boss, MD  neomycin-colistin-hydrocortisone-thonzonium (CORTISPORIN-TC) 3.02-18-09-0.5 MG/ML OTIC suspension Apply 2 drops to ingrown toenail removal site twice daily after soaking. Patient not taking: Reported on 08/03/2022 01/28/22   Lorenda Peck, DPM  OVER THE COUNTER MEDICATION Relaxium- for sleep 14 day trial    [provider]  OVER THE COUNTER MEDICATION Hydrosense- for nose saline solution    [provider]  temazepam (RESTORIL) 15 MG capsule Take 15-30 mg by mouth at bedtime as needed. 07/16/22   [provider]      Allergies    Oxycodone    Review of Systems   Review of Systems  Constitutional:  Positive for chills and fatigue.  HENT: Negative.    Respiratory: Negative.    Cardiovascular: Negative.   Gastrointestinal:  Positive for abdominal pain, diarrhea, nausea and vomiting. Negative for abdominal distention, anal bleeding, blood in stool, constipation and rectal pain.  Genitourinary: Negative.   Musculoskeletal: Negative.   Skin: Negative.   Neurological:  Positive for weakness (generalized) and light-headedness.  All other  systems reviewed and are negative.   Physical Exam Updated Vital Signs BP 114/77   Pulse 96   Temp 97.9 F (36.6 C) (Oral)   Resp (!) 21   Ht '5\' 9"'$  (1.753 m)   Wt 68.9 kg   SpO2 94%   BMI 22.43 kg/m  Physical Exam Vitals and nursing note reviewed.  Constitutional:      General: She is not in acute distress.    Appearance: She is well-developed.  She is not ill-appearing, toxic-appearing or diaphoretic.  HENT:     Head: Atraumatic.  Eyes:     Pupils: Pupils are equal, round, and reactive to light.  Cardiovascular:     Rate and Rhythm: Tachycardia present.     Heart sounds: Normal heart sounds. No murmur heard.    No friction rub. No gallop.  Pulmonary:     Effort: Pulmonary effort is normal. No respiratory distress.     Breath sounds: Normal breath sounds.     Comments: Clear bil, speaks in full sentences without difficulty Abdominal:     General: Bowel sounds are normal. There is no distension.     Palpations: Abdomen is soft.     Tenderness: There is generalized abdominal tenderness and tenderness in the epigastric area, left upper quadrant and left lower quadrant. There is no right CVA tenderness, left CVA tenderness, guarding or rebound.     Hernia: No hernia is present.  Musculoskeletal:        General: Normal range of motion.     Cervical back: Normal range of motion.  Skin:    General: Skin is warm and dry.  Neurological:     General: No focal deficit present.     Mental Status: She is alert.  Psychiatric:        Mood and Affect: Mood normal.     ED Results / Procedures / Treatments   Labs (all labs ordered are listed, but only abnormal results are displayed) Labs Reviewed  LIPASE, BLOOD - Abnormal; Notable for the following components:      Result Value   Lipase 553 (*)    All other components within normal limits  COMPREHENSIVE METABOLIC PANEL - Abnormal; Notable for the following components:   Sodium 133 (*)    Potassium 3.3 (*)    CO2 17 (*)    Glucose, Bld 124 (*)    Creatinine, Ser 1.45 (*)    Calcium 10.4 (*)    Total Protein 8.5 (*)    Albumin 5.1 (*)    AST 52 (*)    ALT 51 (*)    Alkaline Phosphatase 429 (*)    Total Bilirubin 1.4 (*)    GFR, Estimated 42 (*)    Anion gap 17 (*)    All other components within normal limits  CBC - Abnormal; Notable for the following components:   WBC  11.7 (*)    RBC 5.30 (*)    Hemoglobin 17.9 (*)    HCT 48.5 (*)    MCHC 36.9 (*)    All other components within normal limits  URINALYSIS, ROUTINE W REFLEX MICROSCOPIC - Abnormal; Notable for the following components:   Specific Gravity, Urine 1.046 (*)    Protein, ur TRACE (*)    All other components within normal limits  CULTURE, BLOOD (ROUTINE X 2)  CULTURE, BLOOD (ROUTINE X 2)  LACTIC ACID, PLASMA  CK  MAGNESIUM  SODIUM, URINE, RANDOM  CREATININE, URINE, RANDOM  BASIC METABOLIC PANEL  CBC WITH  DIFFERENTIAL/PLATELET  COMPREHENSIVE METABOLIC PANEL    EKG EKG Interpretation  Date/Time:  Monday August 08 2022 16:41:09 EDT Ventricular Rate:  109 PR Interval:  155 QRS Duration: 96 QT Interval:  331 QTC Calculation: 446 R Axis:   33 Text Interpretation: Sinus tachycardia Abnormal R-wave progression, early transition Nonspecific T abnormalities, anterior leads Confirmed by Garnette Gunner [phone]) on 08/08/2022 4:43:07 PM  Radiology CT ABDOMEN PELVIS W CONTRAST  Result Date: 08/08/2022 CLINICAL DATA:  Left lower quadrant abdominal pain EXAM: CT ABDOMEN AND PELVIS WITH CONTRAST TECHNIQUE: Multidetector CT imaging of the abdomen and pelvis was performed using the standard protocol following bolus administration of intravenous contrast. RADIATION DOSE REDUCTION: This exam was performed according to the departmental dose-optimization program which includes automated exposure control, adjustment of the mA and/or kV according to patient size and/or use of iterative reconstruction technique. CONTRAST:  55m OMNIPAQUE IOHEXOL 300 MG/ML  SOLN COMPARISON:  CT abdomen and pelvis dated August 03, 2022 FINDINGS: Lower chest: No acute abnormality. Hepatobiliary: No focal liver abnormality is seen. Mild intrahepatic biliary ductal dilation and moderately dilated common bile duct, similar to prior. Gallbladder is unremarkable. Pancreas: Unremarkable. No pancreatic ductal dilatation or surrounding  inflammatory changes. Spleen: Normal in size without focal abnormality. Adrenals/Urinary Tract: Adrenal glands are unremarkable. Kidneys are normal, without renal calculi, focal lesion, or hydronephrosis. Bladder is unremarkable. Stomach/Bowel: Stomach is within normal limits. Appendix appears normal. Decreased wall thickening and inflammatory change of the sigmoid colon. Mild diverticulosis. No evidence of obstruction. Vascular/Lymphatic: Aortic atherosclerosis. No enlarged abdominal or pelvic lymph nodes. Reproductive: Uterus and bilateral adnexa are unremarkable. Other: No abdominal wall hernia or abnormality. No abdominopelvic ascites. Musculoskeletal: No acute or significant osseous findings. IMPRESSION: 1. Decreased wall thickening and inflammatory change of the sigmoid colon, compatible with resolving acute diverticulitis. 2. Mild intrahepatic biliary ductal dilation and moderate dilation of the common bile duct, unchanged when compared with recent prior CTs. Given history of elevated LFTs, recommend MRCP for further evaluation. 3.  Aortic Atherosclerosis (ICD10-I70.0). Electronically Signed   By: LYetta GlassmanM.D.   On: 08/08/2022 17:40    Procedures .Critical Care  Performed by: HNettie Elm PA-C Authorized by: HNettie Elm PA-C   Critical care provider statement:    Critical care time (minutes):  35   Critical care was necessary to treat or prevent imminent or life-threatening deterioration of the following conditions:  Sepsis, dehydration and hepatic failure   Critical care was time spent personally by me on the following activities:  Development of treatment plan with patient or surrogate, discussions with consultants, evaluation of patient's response to treatment, examination of patient, ordering and review of laboratory studies, ordering and review of radiographic studies, ordering and performing treatments and interventions, pulse oximetry, re-evaluation of patient's  condition and review of old charts     Medications Ordered in ED Medications  piperacillin-tazobactam (ZOSYN) IVPB 3.375 g (3.375 g Intravenous New Bag/Given 08/08/22 1832)    Followed by  piperacillin-tazobactam (ZOSYN) IVPB 3.375 g (has no administration in time range)  0.9 %  sodium chloride infusion ( Intravenous New Bag/Given 08/08/22 1831)  potassium chloride 10 mEq in 100 mL IVPB (has no administration in time range)  HYDROmorphone (DILAUDID) injection 0.5 mg (has no administration in time range)  ondansetron (ZOFRAN) injection 4 mg (has no administration in time range)  HYDROmorphone (DILAUDID) injection 0.5 mg (0.5 mg Intravenous Given 08/08/22 1654)  ondansetron (ZOFRAN) injection 4 mg (4 mg Intravenous Given 08/08/22 1654)  sodium chloride  0.9 % bolus 1,000 mL (0 mLs Intravenous Stopped 08/08/22 1811)  iohexol (OMNIPAQUE) 300 MG/ML solution 100 mL (80 mLs Intravenous Contrast Given 08/08/22 1719)  sodium chloride 0.9 % bolus 1,000 mL (1,000 mLs Intravenous New Bag/Given 08/08/22 1828)    ED Course/ Medical Decision Making/ A&P    61 year old here for evaluation of abdominal pain, nausea, vomiting and diarrhea.  Recently admitted for similar however left AGAINST MEDICAL ADVICE prior to treatment being performed.  She is being treated for diverticulitis as well as for MRCP/ERCP for elevated LFTs in setting of dilated common bile duct.  States she has continued to feel unwell.  Is having worsening abdominal pain multiple episodes of nonbloody emesis and diarrhea.  Some chills at home without documented fever.  Some lightheadedness when she goes from sitting to standing which she relates to dehydration.  Labs and imaging personally viewed and interpreted:  CBC leukocytosis 11.7, hemoglobin 17.9 suspect due to hemoconcentration Lipase 553, prior 152 at time of leaving AMA CMP sodium 133,  potassium 3.3, creatinine 1.45 baseline 0.63, elevated LFT, anion gap 17 CT AP improved  diverticulitis, persistent CBD dilation, rec MRCP   Discussed results with patient.  I recommended admission for continued management and work-up.  She is agreeable.  We have started on IV fluids, pain management, abx.  Plan to message [facility] given this is who she saw in the hospital previously to let them know patient will be readmitted for MRCP so they can follow along.  CONSULT with Dr. Roel Cluck with [facility] agreeablel to accept patient for admission.  Placed additional orders as well as repeat labs for tomorrow morning.  I messaged Dr. Paulita Fujita on-call with GI and had him to the treatment team.  [facility] admitting team as well  PRN orders for pain/antiemetic ordered due to possible delay transfer due to bed availability. PRN after midnight  The patient appears reasonably stabilized for admission considering the current resources, flow, and capabilities available in the ED at this time, and I doubt any other [facility] requiring further screening and/or treatment in the ED prior to admission.                            Medical Decision Making Amount and/or Complexity of Data Reviewed Independent Historian: spouse External Data Reviewed: labs, radiology, ECG and notes. Labs: ordered. Decision-making details documented in ED Course. Radiology: ordered and independent interpretation performed. Decision-making details documented in ED Course. ECG/medicine tests: ordered and independent interpretation performed. Decision-making details documented in ED Course.  Risk OTC drugs. Prescription drug management. Parenteral controlled substances. Decision regarding hospitalization. Diagnosis or treatment significantly limited by social determinants of health.          Final Clinical Impression(s) / ED Diagnoses Final diagnoses:  Hyponatremia  Hypokalemia  Elevated LFTs  AKI (acute kidney injury) ([place])  SIRS (systemic inflammatory response syndrome) ([facility])  Common bile duct dilatation    Rx /  DC Orders ED Discharge Orders     None         Otilia Kareem A, PA-C 08/08/22 1922    Cristie Hem, MD 08/08/22 2359

## 2022-08-08 NOTE — ED Notes (Signed)
Pt attempted to provide urine sample but unable to urinate at this time.

## 2022-08-08 NOTE — ED Notes (Signed)
Had to decrease rate of potassium for pt to tolerate it to 70m/hr

## 2022-08-08 NOTE — ED Triage Notes (Addendum)
Patient arrives with complaints of abdominal pain, diarrhea, dizziness, and vomiting for a few days.   Rates abdominal pain a 6/10. Was recently seen twice for the same and left AMA prior to completing treatment. Reached out to her PCP with no response.

## 2022-08-08 NOTE — Progress Notes (Signed)
Pharmacy Antibiotic Note  Rachel Weber is a 61 y.o. female admitted on 08/08/2022 with abdominal pain and c/f  intra-abdominal infection .  Patient was recently treated with ciprofloxacin/flagyl for IAI in Aug 2023 without improvement in symptoms. Pharmacy has been consulted for Zosyn dosing.  Plan: Zosyn 3.375g IV q8h (4 hour infusion). Monitor CBC, clinical progress, and renal function   Height: '5\' 9"'$  (175.3 cm) Weight: 68.9 kg (151 lb 14.4 oz) IBW/kg (Calculated) : 66.2  Temp (24hrs), Avg:99.1 F (37.3 C), Min:99.1 F (37.3 C), Max:99.1 F (37.3 C)  Recent Labs  Lab 08/03/22 0056 08/03/22 1543 08/04/22 0544 08/05/22 0732 08/08/22 1413 08/08/22 1648  WBC 13.8*  --  7.8 7.3 11.7*  --   CREATININE 1.09* 0.91 0.64 0.63 1.45*  --   LATICACIDVEN  --   --   --   --   --  1.1    Estimated Creatinine Clearance: 43.7 mL/min (A) (by C-G formula based on SCr of 1.45 mg/dL (H)).    Allergies  Allergen Reactions   Oxycodone Other (See Comments)    Hallucinations    Antimicrobials this admission: zosyn 8/21 >>    Dose adjustments this admission:  Microbiology results: 8/21 BCx:    Thank you for allowing pharmacy to be a part of this patient's care.  [place] Pharmacist 08/08/2022 6:02 PM

## 2022-08-08 NOTE — ED Notes (Addendum)
Will repeat bmp at 2200

## 2022-08-08 NOTE — Progress Notes (Signed)
Plan of Care Note for accepted transfer   Patient: Rachel Weber MRN: [id number]   DOA: 08/08/2022  Facility requesting transfer: [facility] Requesting Provider: Nettie Elm, PA-C Reason for transfer: Common bile duct dilatation    Plan of care: The patient is accepted for admission to Telemetry unit, at [facility]..   ER sent msg to [facility] Recovering from diverticultis given ongoing fever WBC elevation cont zosyn Plan for MRCP /ERCP in AM Rehydrate Replace electrolytes  Author: Toy Baker, MD 08/08/2022  Check [email or web address] for on-call coverage.  Nursing staff, Please call [place] number on Amion as soon as patient's arrival, so appropriate admitting provider can evaluate the pt.

## 2022-08-08 NOTE — ED Notes (Signed)
Pt still unable to urinate at this time 

## 2022-08-09 ENCOUNTER — Inpatient Hospital Stay (HOSPITAL_COMMUNITY): Payer: BC Managed Care – PPO

## 2022-08-09 ENCOUNTER — Encounter (HOSPITAL_COMMUNITY): Payer: Self-pay | Admitting: Internal Medicine

## 2022-08-09 DIAGNOSIS — Z881 Allergy status to other antibiotic agents status: Secondary | ICD-10-CM | POA: Diagnosis not present

## 2022-08-09 DIAGNOSIS — F411 Generalized anxiety disorder: Secondary | ICD-10-CM | POA: Diagnosis present

## 2022-08-09 DIAGNOSIS — Z885 Allergy status to narcotic agent status: Secondary | ICD-10-CM | POA: Diagnosis not present

## 2022-08-09 DIAGNOSIS — Z66 Do not resuscitate: Secondary | ICD-10-CM | POA: Diagnosis present

## 2022-08-09 DIAGNOSIS — F39 Unspecified mood [affective] disorder: Secondary | ICD-10-CM | POA: Diagnosis present

## 2022-08-09 DIAGNOSIS — K5792 Diverticulitis of intestine, part unspecified, without perforation or abscess without bleeding: Secondary | ICD-10-CM | POA: Diagnosis present

## 2022-08-09 DIAGNOSIS — E876 Hypokalemia: Secondary | ICD-10-CM | POA: Diagnosis present

## 2022-08-09 DIAGNOSIS — Z79899 Other long term (current) drug therapy: Secondary | ICD-10-CM | POA: Diagnosis not present

## 2022-08-09 DIAGNOSIS — E871 Hypo-osmolality and hyponatremia: Secondary | ICD-10-CM | POA: Diagnosis present

## 2022-08-09 DIAGNOSIS — R112 Nausea with vomiting, unspecified: Secondary | ICD-10-CM | POA: Diagnosis present

## 2022-08-09 DIAGNOSIS — Y92230 Patient room in hospital as the place of occurrence of the external cause: Secondary | ICD-10-CM | POA: Diagnosis present

## 2022-08-09 DIAGNOSIS — T3695XA Adverse effect of unspecified systemic antibiotic, initial encounter: Secondary | ICD-10-CM | POA: Diagnosis not present

## 2022-08-09 DIAGNOSIS — N179 Acute kidney failure, unspecified: Secondary | ICD-10-CM | POA: Diagnosis present

## 2022-08-09 DIAGNOSIS — K831 Obstruction of bile duct: Secondary | ICD-10-CM | POA: Diagnosis present

## 2022-08-09 DIAGNOSIS — K521 Toxic gastroenteritis and colitis: Secondary | ICD-10-CM | POA: Diagnosis not present

## 2022-08-09 DIAGNOSIS — T373X5A Adverse effect of other antiprotozoal drugs, initial encounter: Secondary | ICD-10-CM | POA: Diagnosis present

## 2022-08-09 DIAGNOSIS — F17210 Nicotine dependence, cigarettes, uncomplicated: Secondary | ICD-10-CM | POA: Diagnosis present

## 2022-08-09 LAB — CBC WITH DIFFERENTIAL/PLATELET
Abs Immature Granulocytes: 0.04 10*3/uL (ref 0.00–0.07)
Basophils Absolute: 0 10*3/uL (ref 0.0–0.1)
Basophils Relative: 0 %
Eosinophils Absolute: 0.1 10*3/uL (ref 0.0–0.5)
Eosinophils Relative: 2 %
HCT: 40.1 % (ref 36.0–46.0)
Hemoglobin: 14.7 g/dL (ref 12.0–15.0)
Immature Granulocytes: 1 %
Lymphocytes Relative: 18 %
Lymphs Abs: 1.4 10*3/uL (ref 0.7–4.0)
MCH: 34.2 pg — ABNORMAL HIGH (ref 26.0–34.0)
MCHC: 36.7 g/dL — ABNORMAL HIGH (ref 30.0–36.0)
MCV: 93.3 fL (ref 80.0–100.0)
Monocytes Absolute: 1 10*3/uL (ref 0.1–1.0)
Monocytes Relative: 12 %
Neutro Abs: 5.4 10*3/uL (ref 1.7–7.7)
Neutrophils Relative %: 67 %
Platelets: 300 10*3/uL (ref 150–400)
RBC: 4.3 MIL/uL (ref 3.87–5.11)
RDW: 12.2 % (ref 11.5–15.5)
WBC: 8 10*3/uL (ref 4.0–10.5)
nRBC: 0 % (ref 0.0–0.2)

## 2022-08-09 LAB — COMPREHENSIVE METABOLIC PANEL
ALT: 38 U/L (ref 0–44)
AST: 38 U/L (ref 15–41)
Albumin: 3.8 g/dL (ref 3.5–5.0)
Alkaline Phosphatase: 306 U/L — ABNORMAL HIGH (ref 38–126)
Anion gap: 10 (ref 5–15)
BUN: 9 mg/dL (ref 6–20)
CO2: 21 mmol/L — ABNORMAL LOW (ref 22–32)
Calcium: 9.1 mg/dL (ref 8.9–10.3)
Chloride: 104 mmol/L (ref 98–111)
Creatinine, Ser: 0.75 mg/dL (ref 0.44–1.00)
GFR, Estimated: >60 mL/min (ref >60)
Glucose, Bld: 98 mg/dL (ref 70–99)
Potassium: 3.2 mmol/L — ABNORMAL LOW (ref 3.5–5.1)
Sodium: 135 mmol/L (ref 135–145)
Total Bilirubin: 1.2 mg/dL (ref 0.3–1.2)
Total Protein: 6.4 g/dL — ABNORMAL LOW (ref 6.5–8.1)

## 2022-08-09 MED ORDER — LORAZEPAM 2 MG/ML IJ SOLN: 1.0000 mg | Freq: Once | INTRAMUSCULAR | Status: DC | PRN

## 2022-08-09 MED ORDER — HYDRALAZINE HCL 20 MG/ML IJ SOLN: 10.0000 mg | INTRAMUSCULAR | Status: DC | PRN

## 2022-08-09 MED ORDER — LACTATED RINGERS IV SOLN: INTRAVENOUS | Status: DC

## 2022-08-09 MED ORDER — ALBUTEROL SULFATE HFA 108 (90 BASE) MCG/ACT IN AERS: 1.0000 | INHALATION_SPRAY | RESPIRATORY_TRACT | Status: DC | PRN

## 2022-08-09 MED ORDER — POTASSIUM CHLORIDE CRYS ER 20 MEQ PO TBCR
40.0000 meq | EXTENDED_RELEASE_TABLET | Freq: Two times a day (BID) | ORAL | Status: DC
  Administered 2022-08-09 – 2022-08-10 (×2): 40 meq via ORAL
  Filled 2022-08-09 (×2): qty 2

## 2022-08-09 MED ORDER — LORAZEPAM 1 MG PO TABS
1.0000 mg | ORAL_TABLET | Freq: Two times a day (BID) | ORAL | Status: DC | PRN
Start: 2022-08-09 — End: 2022-08-10

## 2022-08-09 MED ORDER — TEMAZEPAM 15 MG PO CAPS
15.0000 mg | ORAL_CAPSULE | Freq: Every evening | ORAL | Status: DC | PRN
  Administered 2022-08-09: 15 mg via ORAL
  Filled 2022-08-09: qty 1

## 2022-08-09 MED ORDER — ACETAMINOPHEN 325 MG PO TABS
650.0000 mg | ORAL_TABLET | Freq: Four times a day (QID) | ORAL | Status: DC | PRN
  Administered 2022-08-09 – 2022-08-10 (×2): 650 mg via ORAL
  Filled 2022-08-09 (×2): qty 2

## 2022-08-09 MED ORDER — ACETAMINOPHEN 650 MG RE SUPP: 650.0000 mg | Freq: Four times a day (QID) | RECTAL | Status: DC | PRN

## 2022-08-09 MED ORDER — ALBUTEROL SULFATE (2.5 MG/3ML) 0.083% IN NEBU
2.5000 mg | INHALATION_SOLUTION | RESPIRATORY_TRACT | Status: DC | PRN
Start: 2022-08-09 — End: 2022-08-10

## 2022-08-09 MED ORDER — GADOBUTROL 1 MMOL/ML IV SOLN
7.0000 mL | Freq: Once | INTRAVENOUS | Status: AC | PRN
  Administered 2022-08-09: 7 mL via INTRAVENOUS

## 2022-08-09 NOTE — Progress Notes (Signed)
Called into patient's room ,patient was very upset ,on very irritable ,fighting voice '' why I am sitting in here on this dumb place , are you not going to to do something for me,I am hungry, I am in pain and I am paying a thousand dollars just waiting to have this MRI done to me ,where I can I set it up on outpatient clinic and send the result here''.What do you think? Nurse asked her back,are you asking me my opinion about it? Patient said yes.  Nurse responded ,I can't not give an opinion about that situation, but you know the fact that,I explained to you ,what need to be expected,no food,no drink ,pain medicine is not yet time as of this time and I was here at your side,when MRI called me ,and you heard them ,you were in line within 30 minutes ,that's why,I came in here to made aware of you that. But where the hell MRI,I am sitting around here,to make  me hungry ,nobody is helping me,you know I am a [place] ,and our system is not like this. I told patient that ,I would call the MD rescue pain med dose.At this moment transport came for MRI,went to MRI asking/demanding for a pain medicine.

## 2022-08-09 NOTE — Progress Notes (Signed)
Patient called to nursing station,was demanding her discharged papers.Nurse checked her epic chart -no discharge order.Nurse went into patient's room with AMA form,asked patient ,if she wanted to go home.Patient said yes. Nurse explained to her that MD still awaiting the result of MRI,they need to make sure everything would be ok for you before you are going home.,but if you still insisting of going home tonight,it is alright but you need to sign this AMA form.Patient read the form and once she 's done,this is not I wanted ,and threw away the [place]  form toward the window ,she made an angry face by closing her eyes partially,with lips tightened and at the same time ,closing her right hand to make a fist and wave at me and said this  ''all of you guys are hard to me''.Nurse said to patient ''we are just doing our jobs and have a good night. Staffs made aware of patient's behavior.

## 2022-08-09 NOTE — Progress Notes (Signed)
  Transition of Care [facility]) Screening Note   Patient Details  Name: Rachel Weber Date of Birth: 06-08-1961   Transition of Care [facility]) CM/SW Contact:    Pollie Friar, RN Phone Number: 08/09/2022, 2:38 PM    Transition of Care Department [facility]) has reviewed patient. We will continue to monitor patient advancement through interdisciplinary progression rounds. If new patient transition needs arise, please place a TOC consult.

## 2022-08-09 NOTE — Progress Notes (Signed)
Patient arrived from MRI,immediately demanded a pain medicine and something to eat.Nurse explained to her about her medical management,further explaining to her for the MRI result.Patient demanded to be discharged,patient was asking who is her doctor and said '' I could not keep up for my doctors here,don't you know that I have five doctors already here. Nurse did not responded on verbal tantrum of the patient.Patient demanded to be discharged tonight. MD made aware that patient behavior and intention to leave.

## 2022-08-09 NOTE — ED Notes (Signed)
Care handoff given to Charge RN on 82M.

## 2022-08-09 NOTE — Consult Note (Signed)
Referring Provider: Dr. Lorin Mercy Primary Care Physician:  Mckinley Jewel, MD Primary Gastroenterologist:  Althia Forts  Reason for Consultation:  Pancreatitis; Abdominal pain; Abnormal LFTs  HPI: Rachel Weber is a 61 y.o. female who was hospitalized last week with elevated LFTs with dilated CBD on CT and diarrhea with question of diverticulitis on CT and left AMA because of her mother's illness returns with ongoing abdominal pain described as mainly lower abdominal pain (Left > Right) that is sharp and constant. +N/V on ongoing diarrhea. Subjective fevers. On 08/05/22: TB 1.4, ALP 242, AST 59, ALT 71. On 08/08/22: TB 1.4, ALP 429, AST 52, ALT 51. Lipase 553 (152 on 08/05/22).  Past Medical History:  Diagnosis Date   Allergy    Anxiety    Cataract    TMJ arthritis     Past Surgical History:  Procedure Laterality Date   APPENDECTOMY     BREAST SURGERY     COSMETIC SURGERY      Prior to Admission medications   Medication Sig Start Date End Date Taking? Authorizing Provider  acetaminophen-codeine (TYLENOL #3) 300-30 MG tablet Take 1-2 tablets by mouth See admin instructions. 2 to 3 times daily as needed for pain 01/16/20  Yes [provider]  albuterol (PROVENTIL HFA;VENTOLIN HFA) 108 (90 Base) MCG/ACT inhaler INHALE 1-2 PUFFS INTO THE LUNGS EVERY 4 (FOUR) HOURS AS NEEDED FOR WHEEZING OR SHORTNESS OF BREATH. 07/16/18  Yes [name], MD  LORazepam (ATIVAN) 1 MG tablet Take 1 tablet (1 mg total) by mouth 2 (two) times daily as needed for anxiety. 06/16/18  Yes [name], MD  temazepam (RESTORIL) 15 MG capsule Take 15-30 mg by mouth at bedtime as needed for sleep. 07/16/22  Yes [provider]    Scheduled Meds: Continuous Infusions:  lactated ringers     piperacillin-tazobactam (ZOSYN)  IV Stopped (08/09/22 0646)   PRN Meds:.acetaminophen **OR** acetaminophen, albuterol, hydrALAZINE, HYDROmorphone (DILAUDID) injection, LORazepam, LORazepam, ondansetron  (ZOFRAN) IV, temazepam  Allergies as of 08/08/2022 - Review Complete 08/08/2022  Allergen Reaction Noted   Oxycodone Other (See Comments) 07/31/2022    Family History  Problem Relation Age of Onset   Cancer Mother     Social History   Socioeconomic History   Marital status: Married    Spouse name: Not on file   Number of children: 1   Years of education: Not on file   Highest education level: Not on file  Occupational History   Occupation: Secretary/administrator    Comment: from [place]  Tobacco Use   Smoking status: Every Day    Packs/day: 1.00    Years: 30.00    Total pack years: 30.00    Types: Cigarettes   Smokeless tobacco: Never   Tobacco comments:    "Serial quitter" - currently smoking a couple a day  Vaping Use   Vaping Use: Some days  Substance and Sexual Activity   Alcohol use: Not Currently    Comment: social recently, heavy use in her 17s, none recently   Drug use: Not Currently   Sexual activity: Yes  Other Topics Concern   Not on file  Social History Narrative   Not on file   Social Determinants of Health   Financial Resource Strain: Not on file  Food Insecurity: Not on file  Transportation Needs: Not on file  Physical Activity: Not on file  Stress: Not on file  Social Connections: Not on file  Intimate Partner Violence: Not on file  Review of Systems: All negative except as stated above in HPI.  Physical Exam: Vital signs: Vitals:   08/09/22 0855 08/09/22 1047  BP:  106/72  Pulse:  81  Resp:  19  Temp: 98.8 F (37.1 C) 97.9 F (36.6 C)  SpO2:  95%     General:   Lethargic, Well-developed, well-nourished, mild acute distress Head: normocephalic, atraumatic Eyes: anicteric sclera ENT: oropharynx clear Neck: supple, nontender Lungs:  Clear throughout to auscultation.   No wheezes, crackles, or rhonchi. No acute distress. Heart:  Regular rate and rhythm; no murmurs, clicks, rubs,  or gallops. Abdomen: diffuse tenderness with  guarding, soft, nondistended, +BS  Rectal:  Deferred Ext: no edema  GI:  Lab Results: Recent Labs    08/08/22 1413 08/09/22 0500  WBC 11.7* 8.0  HGB 17.9* 14.7  HCT 48.5* 40.1  PLT 390 300   BMET Recent Labs    08/08/22 1413 08/08/22 2200 08/09/22 0500  NA 133* 133* 135  K 3.3* 3.1* 3.2*  CL 99 102 104  CO2 17* 21* 21*  GLUCOSE 124* 115* 98  BUN '13 11 9  '$ CREATININE 1.45* 1.00 0.75  CALCIUM 10.4* 8.8* 9.1   LFT Recent Labs    08/09/22 0500  PROT 6.4*  ALBUMIN 3.8  AST 38  ALT 38  ALKPHOS 306*  BILITOT 1.2   PT/INR No results for input(s): "LABPROT", "INR" in the last 72 hours.   Studies/Results: CT ABDOMEN PELVIS W CONTRAST  Result Date: 08/08/2022 CLINICAL DATA:  Left lower quadrant abdominal pain EXAM: CT ABDOMEN AND PELVIS WITH CONTRAST TECHNIQUE: Multidetector CT imaging of the abdomen and pelvis was performed using the standard protocol following bolus administration of intravenous contrast. RADIATION DOSE REDUCTION: This exam was performed according to the departmental dose-optimization program which includes automated exposure control, adjustment of the mA and/or kV according to patient size and/or use of iterative reconstruction technique. CONTRAST:  57m OMNIPAQUE IOHEXOL 300 MG/ML  SOLN COMPARISON:  CT abdomen and pelvis dated August 03, 2022 FINDINGS: Lower chest: No acute abnormality. Hepatobiliary: No focal liver abnormality is seen. Mild intrahepatic biliary ductal dilation and moderately dilated common bile duct, similar to prior. Gallbladder is unremarkable. Pancreas: Unremarkable. No pancreatic ductal dilatation or surrounding inflammatory changes. Spleen: Normal in size without focal abnormality. Adrenals/Urinary Tract: Adrenal glands are unremarkable. Kidneys are normal, without renal calculi, focal lesion, or hydronephrosis. Bladder is unremarkable. Stomach/Bowel: Stomach is within normal limits. Appendix appears normal. Decreased wall thickening and  inflammatory change of the sigmoid colon. Mild diverticulosis. No evidence of obstruction. Vascular/Lymphatic: Aortic atherosclerosis. No enlarged abdominal or pelvic lymph nodes. Reproductive: Uterus and bilateral adnexa are unremarkable. Other: No abdominal wall hernia or abnormality. No abdominopelvic ascites. Musculoskeletal: No acute or significant osseous findings. IMPRESSION: 1. Decreased wall thickening and inflammatory change of the sigmoid colon, compatible with resolving acute diverticulitis. 2. Mild intrahepatic biliary ductal dilation and moderate dilation of the common bile duct, unchanged when compared with recent prior CTs. Given history of elevated LFTs, recommend MRCP for further evaluation. 3.  Aortic Atherosclerosis (ICD10-I70.0). Electronically Signed   By: LYetta GlassmanM.D.   On: 08/08/2022 17:40    Impression/Plan: Biliary pancreatitis - suspect passed a stone. Transaminases normal now. MRCP pending. Bowel rest. IVFs. Supportive care. Pain meds per primary team.  ERCP necessity pending MRCP findings. Will follow.    LOS: 0 days   VLear Ng 08/09/2022, 12:06 PM  Questions please call [phone]

## 2022-08-09 NOTE — H&P (Signed)
History and Physical    Patient: Rachel Weber PIR:[id number] DOB: Sep 04, 1961 DOA: 08/08/2022 DOS: the patient was seen and examined on 08/09/2022 PCP: Mckinley Jewel, MD  Patient coming from: Home - lives with husband; NOK: Husband, [phone]   Chief Complaint: abdominal pain  HPI: Rachel Weber is a 61 y.o. female with medical history significant of anxiety and tobacco dependence presenting with abdominal pain.  She was previously admitted from 8/16-17 for diverticulitis.  She was started on Zosyn and planned for MRCP/ERCP due to elevated LFTs and dilated CBD on CT but she left AMA.   She returned yesterday to [facility] with abdominal pain, n/v/d.  She was frustrated during her prior hospitalization and her mother was also sick so she needed to get home.  After last dc she went right back to were she was.  She is down 15 pounds in 3 weeks.  She is having significant lower abdominal pain.  She does need medication for the MRCP.  She likes the Dilaudid for pain.  No fevers.  Last n/v was yesterday.  She is still having diarrhea.  She was unable to get any antibiotics after last hospitalization so has not had any.    ER Course:  Carryover, per Dr. Roel Cluck:  61 yo f  Admitted 08/03/2022 for diverticultis.  Was seen by [facility] at that time and was to have an MRCP/possible ERCP due to elevated LFTs with dilated common bile duct stone on CT.  She is also getting antibiotics for diverticulitis, and electrolyte replacement. Patient states she left AGAINST MEDICAL ADVICE W/O ABX  Her symptoms persisted since going home, still fever and chills  CT still shows dilated biliary duct but resolving acute diverticulitis.  Will need to finish work up and get MRCP/ERCP  ER consulted with [facility]  K 3.3 new AKI Cr up to 1.4 bicarb 17  Lactic acid normal  LFT up from prior  Replace K, order urine lytes, repeat labs  IV fluids      Review of Systems: As mentioned in the history of  present illness. All other systems reviewed and are negative. Past Medical History:  Diagnosis Date   Allergy    Anxiety    Cataract    TMJ arthritis    Past Surgical History:  Procedure Laterality Date   APPENDECTOMY     BREAST SURGERY     COSMETIC SURGERY     Social History:  reports that she has been smoking cigarettes. She has a 30.00 pack-year smoking history. She has never used smokeless tobacco. She reports that she does not currently use alcohol. She reports that she does not currently use drugs.  Allergies  Allergen Reactions   Cipro [Ciprofloxacin Hcl] Nausea And Vomiting   Hydrocodone Other (See Comments)    Hallucinations   Oxycontin [Oxycodone Hcl] Nausea And Vomiting and Other (See Comments)    Hallucinations    Family History  Problem Relation Age of Onset   Cancer Mother     Prior to Admission medications   Medication Sig Start Date End Date Taking? Authorizing Provider  acetaminophen-codeine (TYLENOL #3) 300-30 MG tablet Take 1-2 tablets by mouth See admin instructions. 2 to 3 times daily as needed for pain 01/16/20  Yes [provider]  albuterol (PROVENTIL HFA;VENTOLIN HFA) 108 (90 Base) MCG/ACT inhaler INHALE 1-2 PUFFS INTO THE LUNGS EVERY 4 (FOUR) HOURS AS NEEDED FOR WHEEZING OR SHORTNESS OF BREATH. 07/16/18  Yes [name], MD  ibuprofen (ADVIL) 800 MG  tablet Take 1 tablet (800 mg total) by mouth every 8 (eight) hours as needed. Patient not taking: Reported on 08/03/2022 01/27/22   Lorenda Peck, DPM  LORazepam (ATIVAN) 1 MG tablet Take 1 tablet (1 mg total) by mouth 2 (two) times daily as needed for anxiety. 06/16/18  Yes [name], MD  temazepam (RESTORIL) 15 MG capsule Take 15-30 mg by mouth at bedtime as needed for sleep. 07/16/22  Yes [provider]  ciprofloxacin (CIPRO) 500 MG tablet Take 1 tablet (500 mg total) by mouth every 12 (twelve) hours. Patient not taking: Reported on 08/03/2022 07/31/22   Pattricia Boss, MD   metroNIDAZOLE (FLAGYL) 500 MG tablet Take 1 tablet (500 mg total) by mouth 2 (two) times daily. Patient not taking: Reported on 08/03/2022 07/31/22   Pattricia Boss, MD    Physical Exam: Vitals:   08/09/22 0645 08/09/22 0800 08/09/22 0855 08/09/22 1047  BP: 101/70 100/73  106/72  Pulse: 84 75  81  Resp: '15 17  19  '$ Temp:   98.8 F (37.1 C) 97.9 F (36.6 C)  TempSrc:   Oral   SpO2: 95% 94%  95%  Weight:    70.9 kg  Height:    '5\' 9"'$  (1.753 m)   General:  Appears calm and comfortable and is in NAD Eyes:   EOMI, normal lids, iris ENT:  grossly normal hearing, lips & tongue, mmm Neck:  no LAD, masses or thyromegaly Cardiovascular:  RRR, no m/r/g. No LE edema.  Respiratory:   CTA bilaterally with no wheezes/rales/rhonchi.  Normal respiratory effort. Abdomen:  soft, mild B LQ TTP without epigastric or upper abdominal discomfort, ND, NABS Skin:  no rash or induration seen on limited exam Musculoskeletal:  grossly normal tone BUE/BLE, good ROM, no bony abnormality Psychiatric:  grossly normal mood and affect, speech fluent and appropriate, AOx3 Neurologic:  CN 2-12 grossly intact, moves all extremities in coordinated fashion   Radiological Exams on Admission: Independently reviewed - see discussion in A/P where applicable  CT ABDOMEN PELVIS W CONTRAST  Result Date: 08/08/2022 CLINICAL DATA:  Left lower quadrant abdominal pain EXAM: CT ABDOMEN AND PELVIS WITH CONTRAST TECHNIQUE: Multidetector CT imaging of the abdomen and pelvis was performed using the standard protocol following bolus administration of intravenous contrast. RADIATION DOSE REDUCTION: This exam was performed according to the departmental dose-optimization program which includes automated exposure control, adjustment of the mA and/or kV according to patient size and/or use of iterative reconstruction technique. CONTRAST:  7m OMNIPAQUE IOHEXOL 300 MG/ML  SOLN COMPARISON:  CT abdomen and pelvis dated August 03, 2022 FINDINGS:  Lower chest: No acute abnormality. Hepatobiliary: No focal liver abnormality is seen. Mild intrahepatic biliary ductal dilation and moderately dilated common bile duct, similar to prior. Gallbladder is unremarkable. Pancreas: Unremarkable. No pancreatic ductal dilatation or surrounding inflammatory changes. Spleen: Normal in size without focal abnormality. Adrenals/Urinary Tract: Adrenal glands are unremarkable. Kidneys are normal, without renal calculi, focal lesion, or hydronephrosis. Bladder is unremarkable. Stomach/Bowel: Stomach is within normal limits. Appendix appears normal. Decreased wall thickening and inflammatory change of the sigmoid colon. Mild diverticulosis. No evidence of obstruction. Vascular/Lymphatic: Aortic atherosclerosis. No enlarged abdominal or pelvic lymph nodes. Reproductive: Uterus and bilateral adnexa are unremarkable. Other: No abdominal wall hernia or abnormality. No abdominopelvic ascites. Musculoskeletal: No acute or significant osseous findings. IMPRESSION: 1. Decreased wall thickening and inflammatory change of the sigmoid colon, compatible with resolving acute diverticulitis. 2. Mild intrahepatic biliary ductal dilation and moderate dilation of the common bile  duct, unchanged when compared with recent prior CTs. Given history of elevated LFTs, recommend MRCP for further evaluation. 3.  Aortic Atherosclerosis (ICD10-I70.0). Electronically Signed   By: Yetta Glassman M.D.   On: 08/08/2022 17:40    EKG: Independently reviewed.  Sinus tachycardia with rate 109; no evidence of acute ischemia   Labs on Admission: I have personally reviewed the available labs and imaging studies at the time of the admission.  Pertinent labs:    K+ 3.1 -> 3.2 AP 429 -> 306 Normalized LFTs; 52/51/1.4 on 8/21 and 117/111/3.9 on 8/16 WBC 8, 11.7 on 8/21 UA trace protein    Assessment and Plan: Principal Problem:   Acute diverticulitis Active Problems:   Generalized anxiety disorder    Biliary obstruction   DNR (do not resuscitate)    Diverticulitis -Patient was recently admitted for abdominal pain, imaging c/w diverticulitis -She was started on Zosyn but left AMA -She did not have further antibiotics after dc -She has had ongoing pain, diarrhea -Imaging appears to show improving diverticulitis -Will resume Zosyn -Admit to med surg (admitted to tele but will dc at this time)  Bile duct dilatation -Prior elevated LFTs, now normalized -Unchanged ductal dilatation on CT -Possible passed stone -MRCP ordered -GI is consulting -May need ERCP and/or surgery consult depending on results -Bowel rest, as per GI  Mood d/o -Patient became angry and left AMA last hospitalization -She has already expressed frustration about having to wait for the MRCP during this hospitalization since she is [place] and reports that things would be handled differently there -Continue lorazepam and temazepam as per home directions  DNR -I have discussed code status with the patient and she would not desire resuscitation and would prefer to die a natural death should that situation arise. -She will need a gold out of facility DNR form at the time of discharge     Advance Care Planning:   Code Status: DNR   Consults: GI  DVT Prophylaxis: SCDs  Family Communication: Husband presented at the end of the encounter and was present for discussion about the plan  Severity of Illness: The appropriate patient status for this patient is INPATIENT. Inpatient status is judged to be reasonable and necessary in order to provide the required intensity of service to ensure the patient's safety. The patient's presenting symptoms, physical exam findings, and initial radiographic and laboratory data in the context of their chronic comorbidities is felt to place them at high risk for further clinical deterioration. Furthermore, it is not anticipated that the patient will be medically stable for discharge  from the hospital within 2 midnights of admission.   * I certify that at the point of admission it is my clinical judgment that the patient will require inpatient hospital care spanning beyond 2 midnights from the point of admission due to high intensity of service, high risk for further deterioration and high frequency of surveillance required.*  Author: Karmen Bongo, MD 08/09/2022 5:20 PM  For on call review [email or web address].

## 2022-08-10 ENCOUNTER — Other Ambulatory Visit (HOSPITAL_COMMUNITY): Payer: Self-pay

## 2022-08-10 DIAGNOSIS — K5792 Diverticulitis of intestine, part unspecified, without perforation or abscess without bleeding: Secondary | ICD-10-CM | POA: Diagnosis not present

## 2022-08-10 LAB — COMPREHENSIVE METABOLIC PANEL
ALT: 40 U/L (ref 0–44)
AST: 42 U/L — ABNORMAL HIGH (ref 15–41)
Albumin: 3.6 g/dL (ref 3.5–5.0)
Alkaline Phosphatase: 277 U/L — ABNORMAL HIGH (ref 38–126)
Anion gap: 7 (ref 5–15)
BUN: 6 mg/dL (ref 6–20)
CO2: 21 mmol/L — ABNORMAL LOW (ref 22–32)
Calcium: 9.5 mg/dL (ref 8.9–10.3)
Chloride: 108 mmol/L (ref 98–111)
Creatinine, Ser: 0.71 mg/dL (ref 0.44–1.00)
GFR, Estimated: >60 mL/min (ref >60)
Glucose, Bld: 89 mg/dL (ref 70–99)
Potassium: 3.9 mmol/L (ref 3.5–5.1)
Sodium: 136 mmol/L (ref 135–145)
Total Bilirubin: 0.9 mg/dL (ref 0.3–1.2)
Total Protein: 6.3 g/dL — ABNORMAL LOW (ref 6.5–8.1)

## 2022-08-10 LAB — CBC
HCT: 40.5 % (ref 36.0–46.0)
Hemoglobin: 14.8 g/dL (ref 12.0–15.0)
MCH: 34.4 pg — ABNORMAL HIGH (ref 26.0–34.0)
MCHC: 36.5 g/dL — ABNORMAL HIGH (ref 30.0–36.0)
MCV: 94.2 fL (ref 80.0–100.0)
Platelets: 360 10*3/uL (ref 150–400)
RBC: 4.3 MIL/uL (ref 3.87–5.11)
RDW: 12 % (ref 11.5–15.5)
WBC: 7.8 10*3/uL (ref 4.0–10.5)
nRBC: 0 % (ref 0.0–0.2)

## 2022-08-10 MED ORDER — SACCHAROMYCES BOULARDII 250 MG PO CAPS
250.0000 mg | ORAL_CAPSULE | Freq: Two times a day (BID) | ORAL | 0 refills | Status: DC
  Filled 2022-08-10: qty 14, 7d supply, fill #0

## 2022-08-10 NOTE — Progress Notes (Signed)
Patient left unit to see husband outside. Consulted with Agricultural consultant. Ok to let patient outside for a while. No tele order. IV ABX paused.

## 2022-08-12 NOTE — Discharge Summary (Signed)
Physician Discharge Summary   Patient: Rachel Weber MRN: [id number] DOB: [birth year]  Admit date:     08/08/2022  Discharge date: 08/10/2022  Discharge Physician: Berle Mull  PCP: Mckinley Jewel, MD  Recommendations at discharge: Follow-up with PCP in 1 week. Follow-up with GI for colonoscopy after diverticulitis episode.   Follow-up Information     Pahwani, Michell Heinrich, MD. Schedule an appointment as soon as possible for a visit in 1 week(s).   Specialty: Internal Medicine Contact information: [address] [place] [address] [phone]         Clarene Essex, MD Follow up in 2 month(s).   Specialty: Gastroenterology Why: for colonoscopy and follow up of bile duct dilation. Contact information: [address] [place] [address] [phone]                Discharge Diagnoses: Principal Problem:   Acute diverticulitis Active Problems:   Generalized anxiety disorder   Biliary obstruction   DNR (do not resuscitate)  Assessment and Plan  Diverticulitis -Patient was recently admitted for abdominal pain, imaging c/w diverticulitis -She was started on Zosyn but left AMA, patient actually finished Cipro alone after leaving AMA. -She has had ongoing pain, diarrhea -Imaging appears to show improving diverticulitis -For now we will hold off on antibiotics.  Add probiotics. Outpatient follow-up with GI for colonoscopy recommended. For now hold off on antibiotics.   Bile duct dilatation -Prior elevated LFTs, now normalized -Unchanged ductal dilatation on CT -Possible passed stone -MRCP also shows no evidence of obstruction or blockage or tumor or stone. Discussed with GI. Most likely patient had a stone/sludge which she passed.  No indication for ERCP in the hospital. Outpatient follow-up with GI recommended.  No indication for antibiotics as well.   Mood d/o -Patient became angry and left AMA last hospitalization -She has  already expressed frustration about having to wait for the MRCP during this hospitalization since she is [place] and reports that things would be handled differently there -Continue lorazepam and temazepam as per home directions  Nausea and vomiting secondary to Flagyl. Allergy attributed to Cipro erroneously. Discussed with patient and corrected in the chart.  Diarrhea. Most likely antibiotic induced. We will add probiotics for a few days.   DNR Admitting MD discussed code status with the patient and she would not desire resuscitation and would prefer to die a natural death should that situation arise. Recommend follow-up with PCP for further discussion of goals of care.  Consultants:  Gastroenterology   Procedures performed:  non  DISCHARGE MEDICATION: Allergies as of 08/10/2022       Reactions   Flagyl [metronidazole] Nausea And Vomiting   Hydrocodone Other (See Comments)   Hallucinations   Oxycontin [oxycodone Hcl] Nausea And Vomiting, Other (See Comments)   Hallucinations        Medication List     TAKE these medications    acetaminophen-codeine 300-30 MG tablet Commonly known as: TYLENOL #3 Take 1-2 tablets by mouth See admin instructions. 2 to 3 times daily as needed for pain   albuterol 108 (90 Base) MCG/ACT inhaler Commonly known as: VENTOLIN HFA INHALE 1-2 PUFFS INTO THE LUNGS EVERY 4 (FOUR) HOURS AS NEEDED FOR WHEEZING OR SHORTNESS OF BREATH.   LORazepam 1 MG tablet Commonly known as: ATIVAN Take 1 tablet (1 mg total) by mouth 2 (two) times daily as needed for anxiety.   saccharomyces boulardii 250 MG capsule Commonly known as: Florastor Take 1  capsule (250 mg total) by mouth 2 (two) times daily for 7 days.   temazepam 15 MG capsule Commonly known as: RESTORIL Take 15-30 mg by mouth at bedtime as needed for sleep.       Disposition: Home Diet recommendation: Cardiac diet  Discharge Exam: Vitals:   08/09/22 1047 08/09/22 2034 08/10/22 0519  08/10/22 1100  BP: 106/72 113/72 108/70 113/70  Pulse: 81 87 70 78  Resp: '19 18 18   '$ Temp: 97.9 F (36.6 C) 98.5 F (36.9 C)    TempSrc:  Oral  Oral  SpO2: 95% 96% 95% 96%  Weight: 70.9 kg     Height: '5\' 9"'$  (1.753 m)      General: Appear in no distress; no visible Abnormal Neck Mass Or lumps, Conjunctiva normal Cardiovascular: S1 and S2 Present, no Murmur, Respiratory: good respiratory effort, Bilateral Air entry present and CTA, no Crackles, no wheezes Abdomen: Bowel Sound present, mild lower abdominal tenderness Extremities: no Pedal edema Neurology: alert and oriented to time, place, and person  Filed Weights   08/08/22 1410 08/09/22 1047  Weight: 68.9 kg 70.9 kg   Condition at discharge: stable  The results of significant diagnostics from this hospitalization (including imaging, microbiology, ancillary and laboratory) are listed below for reference.   Imaging Studies: MR ABDOMEN MRCP W WO CONTAST  Result Date: 08/09/2022 CLINICAL DATA:  Epigastric abdominal pain. EXAM: MRI ABDOMEN WITHOUT AND WITH CONTRAST (INCLUDING MRCP) TECHNIQUE: Multiplanar multisequence MR imaging of the abdomen was performed both before and after the administration of intravenous contrast. Heavily T2-weighted images of the biliary and pancreatic ducts were obtained, and three-dimensional MRCP images were rendered by post processing. CONTRAST:  42m GADAVIST GADOBUTROL 1 MMOL/ML IV SOLN COMPARISON:  Multiple prior imaging studies including most recent CT abdomen pelvis dated August 08, 2022 and most remote CT dated July 31, 2022. FINDINGS: Lower chest: Consolidation versus atelectasis in the right lung base. Hepatobiliary: No significant hepatic steatosis. No suspicious hepatic lesion. Gallbladder is distended without gallbladder wall thickening or pericholecystic fluid. There is dilation of the intra and extrahepatic biliary tree with the common duct measuring up to 12 mm at the level of the pancreatic head  with gentle tapering of the duct to the ampulla. No evidence of extrinsic compression, choledocholithiasis or ampullary head mass identified. No peribiliary enhancement. Pancreas: Intrinsic T1 signal of the pancreatic parenchyma is within normal limits. Homogeneous postcontrast enhancement of the pancreas. No pancreatic ductal dilation. No pancreatic divisum. No cystic or solid hyperenhancing pancreatic lesion identified. Spleen:  No splenomegaly or focal splenic lesion. Adrenals/Urinary Tract: Bilateral adrenal glands appear normal. No hydronephrosis. No solid enhancing renal mass. Stomach/Bowel: Visualized portions within the abdomen are unremarkable. Vascular/Lymphatic: No pathologically enlarged lymph nodes identified. No abdominal aortic aneurysm demonstrated. Other:  No significant abdominal free fluid. Musculoskeletal: No suspicious bone lesions identified. IMPRESSION: 1. Intra and extrahepatic biliary ductal dilation with the common duct measuring up to 12 mm and distal tapering of the duct to the level of the ampulla, no evidence of extrinsic compression, choledocholithiasis or ampullary head mass. No peribiliary enhancement or pancreatic ductal dilation. Nonspecific findings possibly reflecting sequela of a recently passed gallstone although there are no cholelithiasis visualized currently in the gallbladder. Pertinent alternate considerations in a patient with an intermittent pattern of cholestatic laboratory values would include sclerosing or autoimmune cholangitis. No ductal stricture/beading or peribiliary enhancement identified on this examination. 2. Gallbladder is distended without gallbladder wall thickening or pericholecystic fluid possibly reflecting fasting state. 3. Consolidation  versus atelectasis in the right lower lobe. Electronically Signed   By: Dahlia Bailiff M.D.   On: 08/09/2022 18:15   MR 3D Recon At Scanner  Result Date: 08/09/2022 CLINICAL DATA:  Epigastric abdominal pain. EXAM:  MRI ABDOMEN WITHOUT AND WITH CONTRAST (INCLUDING MRCP) TECHNIQUE: Multiplanar multisequence MR imaging of the abdomen was performed both before and after the administration of intravenous contrast. Heavily T2-weighted images of the biliary and pancreatic ducts were obtained, and three-dimensional MRCP images were rendered by post processing. CONTRAST:  58m GADAVIST GADOBUTROL 1 MMOL/ML IV SOLN COMPARISON:  Multiple prior imaging studies including most recent CT abdomen pelvis dated August 08, 2022 and most remote CT dated July 31, 2022. FINDINGS: Lower chest: Consolidation versus atelectasis in the right lung base. Hepatobiliary: No significant hepatic steatosis. No suspicious hepatic lesion. Gallbladder is distended without gallbladder wall thickening or pericholecystic fluid. There is dilation of the intra and extrahepatic biliary tree with the common duct measuring up to 12 mm at the level of the pancreatic head with gentle tapering of the duct to the ampulla. No evidence of extrinsic compression, choledocholithiasis or ampullary head mass identified. No peribiliary enhancement. Pancreas: Intrinsic T1 signal of the pancreatic parenchyma is within normal limits. Homogeneous postcontrast enhancement of the pancreas. No pancreatic ductal dilation. No pancreatic divisum. No cystic or solid hyperenhancing pancreatic lesion identified. Spleen:  No splenomegaly or focal splenic lesion. Adrenals/Urinary Tract: Bilateral adrenal glands appear normal. No hydronephrosis. No solid enhancing renal mass. Stomach/Bowel: Visualized portions within the abdomen are unremarkable. Vascular/Lymphatic: No pathologically enlarged lymph nodes identified. No abdominal aortic aneurysm demonstrated. Other:  No significant abdominal free fluid. Musculoskeletal: No suspicious bone lesions identified. IMPRESSION: 1. Intra and extrahepatic biliary ductal dilation with the common duct measuring up to 12 mm and distal tapering of the duct to  the level of the ampulla, no evidence of extrinsic compression, choledocholithiasis or ampullary head mass. No peribiliary enhancement or pancreatic ductal dilation. Nonspecific findings possibly reflecting sequela of a recently passed gallstone although there are no cholelithiasis visualized currently in the gallbladder. Pertinent alternate considerations in a patient with an intermittent pattern of cholestatic laboratory values would include sclerosing or autoimmune cholangitis. No ductal stricture/beading or peribiliary enhancement identified on this examination. 2. Gallbladder is distended without gallbladder wall thickening or pericholecystic fluid possibly reflecting fasting state. 3. Consolidation versus atelectasis in the right lower lobe. Electronically Signed   By: JDahlia BailiffM.D.   On: 08/09/2022 18:15   CT ABDOMEN PELVIS W CONTRAST  Result Date: 08/08/2022 CLINICAL DATA:  Left lower quadrant abdominal pain EXAM: CT ABDOMEN AND PELVIS WITH CONTRAST TECHNIQUE: Multidetector CT imaging of the abdomen and pelvis was performed using the standard protocol following bolus administration of intravenous contrast. RADIATION DOSE REDUCTION: This exam was performed according to the departmental dose-optimization program which includes automated exposure control, adjustment of the mA and/or kV according to patient size and/or use of iterative reconstruction technique. CONTRAST:  890mOMNIPAQUE IOHEXOL 300 MG/ML  SOLN COMPARISON:  CT abdomen and pelvis dated August 03, 2022 FINDINGS: Lower chest: No acute abnormality. Hepatobiliary: No focal liver abnormality is seen. Mild intrahepatic biliary ductal dilation and moderately dilated common bile duct, similar to prior. Gallbladder is unremarkable. Pancreas: Unremarkable. No pancreatic ductal dilatation or surrounding inflammatory changes. Spleen: Normal in size without focal abnormality. Adrenals/Urinary Tract: Adrenal glands are unremarkable. Kidneys are  normal, without renal calculi, focal lesion, or hydronephrosis. Bladder is unremarkable. Stomach/Bowel: Stomach is within normal limits. Appendix appears normal. Decreased wall  thickening and inflammatory change of the sigmoid colon. Mild diverticulosis. No evidence of obstruction. Vascular/Lymphatic: Aortic atherosclerosis. No enlarged abdominal or pelvic lymph nodes. Reproductive: Uterus and bilateral adnexa are unremarkable. Other: No abdominal wall hernia or abnormality. No abdominopelvic ascites. Musculoskeletal: No acute or significant osseous findings. IMPRESSION: 1. Decreased wall thickening and inflammatory change of the sigmoid colon, compatible with resolving acute diverticulitis. 2. Mild intrahepatic biliary ductal dilation and moderate dilation of the common bile duct, unchanged when compared with recent prior CTs. Given history of elevated LFTs, recommend MRCP for further evaluation. 3.  Aortic Atherosclerosis (ICD10-I70.0). Electronically Signed   By: Yetta Glassman M.D.   On: 08/08/2022 17:40   US Abdomen Limited RUQ (LIVER/GB)  Result Date: 08/03/2022 CLINICAL DATA:  61 year old female with elevated liver function tests. EXAM: ULTRASOUND ABDOMEN LIMITED RIGHT UPPER QUADRANT COMPARISON:  CT abdomen pelvis from the same day. FINDINGS: Gallbladder: Mildly distended with layering sludge. No gallbladder wall thickening or pericholecystic fluid. No sonographic Murphy sign. Common bile duct: Diameter: 1.0 cm Liver: No focal lesion identified. Diffusely increased echogenicity. Smooth contour. Portal vein is patent on color Doppler imaging with normal direction of blood flow towards the liver. Other: No perihepatic ascites. IMPRESSION: 1. Mild hepatic steatosis. 2. Gallbladder sludge, no sonographic evidence of acute cholecystitis. Ruthann Cancer, MD [facility] Radiology Electronically Signed   By: Ruthann Cancer M.D.   On: 08/03/2022 16:31   CT ABDOMEN  PELVIS W CONTRAST  Result Date: 08/03/2022 CLINICAL DATA:  Abdominal pain EXAM: CT ABDOMEN AND PELVIS WITH CONTRAST TECHNIQUE: Multidetector CT imaging of the abdomen and pelvis was performed using the standard protocol following bolus administration of intravenous contrast. RADIATION DOSE REDUCTION: This exam was performed according to the departmental dose-optimization program which includes automated exposure control, adjustment of the mA and/or kV according to patient size and/or use of iterative reconstruction technique. CONTRAST:  71m OMNIPAQUE IOHEXOL 300 MG/ML  SOLN COMPARISON:  07/31/2022 FINDINGS: Lower chest: Mild atelectasis is noted in the right lung base. Hepatobiliary: Gallbladder is well distended. Liver is well visualized without focal mass. Intrahepatic biliary ductal dilatation is seen new from the prior exam. The common bile duct is prominent measuring up to 12 mm. No definitive choledocholithiasis is seen. Pancreas: Unremarkable. No pancreatic ductal dilatation or surrounding inflammatory changes. Spleen: Normal in size without focal abnormality. Adrenals/Urinary Tract: Adrenal glands are within normal limits. Kidneys demonstrate a normal enhancement pattern. No renal calculi or obstructive changes are noted. Normal excretion is noted on delayed images. Ureters are within normal limits. The bladder is unremarkable. Stomach/Bowel: The appendix has been surgically removed. Changes of mild diverticulitis are seen in the sigmoid colon. No perforation or abscess formation is noted. These changes are new from the prior exam. Small bowel and stomach are unremarkable with the exception of a small sliding-type hiatal hernia. Vascular/Lymphatic: Aortic atherosclerosis. No enlarged abdominal or pelvic lymph nodes. Reproductive: Uterus and bilateral adnexa are unremarkable. Other: No abdominal wall hernia or abnormality. No abdominopelvic ascites. Musculoskeletal: No acute or significant osseous  findings. IMPRESSION: Changes of very mild diverticulitis in the sigmoid colon without perforation or abscess formation. This is new from the prior exam. No evidence of pancreatitis. Dilatation of the common bile duct 12 mm although no choledocholithiasis is seen. Very mild intrahepatic ductal dilatation is seen as well. Electronically Signed   By: MInez CatalinaM.D.   On: 08/03/2022 03:34   CT ABDOMEN PELVIS W CONTRAST  Result Date: 07/31/2022 CLINICAL DATA:  Dizziness, diarrhea, weight loss EXAM: CT ABDOMEN AND PELVIS WITH CONTRAST TECHNIQUE: Multidetector CT imaging of the abdomen and pelvis was performed using the standard protocol following bolus administration of intravenous contrast. RADIATION DOSE REDUCTION: This exam was performed according to the departmental dose-optimization program which includes automated exposure control, adjustment of the mA and/or kV according to patient size and/or use of iterative reconstruction technique. CONTRAST:  11m OMNIPAQUE IOHEXOL 300 MG/ML  SOLN COMPARISON:  None Available. FINDINGS: Lower chest: Small linear densities in posterior right lower lung field may suggest minimal scarring. Hepatobiliary: There is fatty infiltration in liver. There is no significant dilation of intrahepatic bile ducts. Distal common bile duct in the head of the pancreas measures 10 mm. Gallbladder is distended. This may be due to fasting state. There is no wall thickening. There is no fluid around the gallbladder. Pancreas: There is minimal prominence of pancreatic duct. No significant inflammatory stranding is noted adjacent to pancreas. There are no loculated fluid collections in or around the pancreas. There is a tiny punctate calcification in the body. Spleen: Unremarkable. Adrenals/Urinary Tract: Adrenals are unremarkable. There is no hydronephrosis. There are no renal or ureteral stones. Urinary bladder is unremarkable. Stomach/Bowel: Small hiatal hernia is seen. Stomach is  unremarkable. Small bowel loops are not dilated. Appendix is not seen. There is fluid in the lumen of: And rectum. There is mild mucosal enhancement in colon. There is no significant focal wall thickening. There is no pericolic stranding. Vascular/Lymphatic: Scattered arterial calcifications are seen. Reproductive: Unremarkable. Other: There is no ascites or pneumoperitoneum. Musculoskeletal: Unremarkable. IMPRESSION: There is no evidence of intestinal obstruction or pneumoperitoneum. There is no hydronephrosis. There is mild mucosal enhancement in colon. There is fluid in the lumen of colon and rectum. Findings suggest possible nonspecific enterocolitis. Small hiatal hernia.  Fatty liver. Other findings as described in the body of the report. Electronically Signed   By: PElmer PickerM.D.   On: 07/31/2022 16:33    Microbiology: Results for orders placed or performed during the hospital encounter of 08/08/22  Blood culture (routine x 2)     Status: None (Preliminary result)   Collection Time: 08/08/22  4:45 PM   Specimen: BLOOD  Result Value Ref Range Status   Specimen Description   Final    BLOOD BLOOD RIGHT ARM Performed at [facility], [address] [place] [id number]   Special Requests   Final    BOTTLES DRAWN AEROBIC AND ANAEROBIC Blood Culture adequate volume   Culture   Final    NO GROWTH 4 DAYS Performed at [facility] [address], [place] [id number]   Report Status PENDING  Incomplete  Blood culture (routine x 2)     Status: None (Preliminary result)   Collection Time: 08/08/22  4:50 PM   Specimen: BLOOD  Result Value Ref Range Status   Specimen Description   Final    BLOOD LEFT ANTECUBITAL Performed at [facility], [address] [place] [address]   Special Requests   Final    BOTTLES DRAWN AEROBIC AND ANAEROBIC Blood Culture results may not be optimal due to an excessive volume of blood  received in culture bottles   Culture   Final    NO GROWTH 4 DAYS Performed at [facility] [place] [id number]   Report Status PENDING  Incomplete   Labs: CBC: Recent Labs  Lab 08/08/22 1413  08/09/22 0500 08/10/22 0539  WBC 11.7* 8.0 7.8  NEUTROABS  --  5.4  --   HGB 17.9* 14.7 14.8  HCT 48.5* 40.1 40.5  MCV 91.5 93.3 94.2  PLT 390 300 924   Basic Metabolic Panel: Recent Labs  Lab 08/08/22 1413 08/08/22 1843 08/08/22 2200 08/09/22 0500 08/10/22 0539  NA 133*  --  133* 135 136  K 3.3*  --  3.1* 3.2* 3.9  CL 99  --  102 104 108  CO2 17*  --  21* 21* 21*  GLUCOSE 124*  --  115* 98 89  BUN 13  --  '11 9 6  '$ CREATININE 1.45*  --  1.00 0.75 0.71  CALCIUM 10.4*  --  8.8* 9.1 9.5  MG  --  1.8  --   --   --    Liver Function Tests: Recent Labs  Lab 08/08/22 1413 08/09/22 0500 08/10/22 0539  AST 52* 38 42*  ALT 51* 38 40  ALKPHOS 429* 306* 277*  BILITOT 1.4* 1.2 0.9  PROT 8.5* 6.4* 6.3*  ALBUMIN 5.1* 3.8 3.6   CBG: No results for input(s): "GLUCAP" in the last 168 hours.  Discharge time spent: greater than 30 minutes.  Signed: Berle Mull, MD [facility] 08/10/2022

## 2022-08-13 ENCOUNTER — Encounter (HOSPITAL_BASED_OUTPATIENT_CLINIC_OR_DEPARTMENT_OTHER): Payer: Self-pay

## 2022-08-13 ENCOUNTER — Observation Stay (HOSPITAL_BASED_OUTPATIENT_CLINIC_OR_DEPARTMENT_OTHER)
Admission: EM | Admit: 2022-08-13 | Discharge: 2022-08-16 | Disposition: A | Discharge status: Home / Self Care | Payer: BC Managed Care – PPO | Attending: Internal Medicine | Admitting: Internal Medicine

## 2022-08-13 ENCOUNTER — Other Ambulatory Visit: Payer: Self-pay

## 2022-08-13 ENCOUNTER — Encounter (HOSPITAL_COMMUNITY): Payer: Self-pay

## 2022-08-13 DIAGNOSIS — R634 Abnormal weight loss: Secondary | ICD-10-CM | POA: Insufficient documentation

## 2022-08-13 DIAGNOSIS — R197 Diarrhea, unspecified: Secondary | ICD-10-CM | POA: Diagnosis not present

## 2022-08-13 DIAGNOSIS — R109 Unspecified abdominal pain: Secondary | ICD-10-CM | POA: Diagnosis present

## 2022-08-13 DIAGNOSIS — E8729 Other acidosis: Secondary | ICD-10-CM | POA: Diagnosis present

## 2022-08-13 DIAGNOSIS — K2970 Gastritis, unspecified, without bleeding: Secondary | ICD-10-CM | POA: Insufficient documentation

## 2022-08-13 DIAGNOSIS — R748 Abnormal levels of other serum enzymes: Secondary | ICD-10-CM

## 2022-08-13 DIAGNOSIS — K5730 Diverticulosis of large intestine without perforation or abscess without bleeding: Secondary | ICD-10-CM | POA: Insufficient documentation

## 2022-08-13 DIAGNOSIS — R7989 Other specified abnormal findings of blood chemistry: Secondary | ICD-10-CM

## 2022-08-13 DIAGNOSIS — F411 Generalized anxiety disorder: Secondary | ICD-10-CM | POA: Diagnosis present

## 2022-08-13 DIAGNOSIS — R112 Nausea with vomiting, unspecified: Secondary | ICD-10-CM | POA: Diagnosis present

## 2022-08-13 DIAGNOSIS — K635 Polyp of colon: Secondary | ICD-10-CM | POA: Diagnosis not present

## 2022-08-13 DIAGNOSIS — D123 Benign neoplasm of transverse colon: Principal | ICD-10-CM | POA: Insufficient documentation

## 2022-08-13 DIAGNOSIS — K648 Other hemorrhoids: Secondary | ICD-10-CM | POA: Diagnosis not present

## 2022-08-13 DIAGNOSIS — E860 Dehydration: Secondary | ICD-10-CM | POA: Diagnosis present

## 2022-08-13 DIAGNOSIS — K449 Diaphragmatic hernia without obstruction or gangrene: Secondary | ICD-10-CM | POA: Diagnosis not present

## 2022-08-13 DIAGNOSIS — N179 Acute kidney failure, unspecified: Secondary | ICD-10-CM

## 2022-08-13 DIAGNOSIS — F17210 Nicotine dependence, cigarettes, uncomplicated: Secondary | ICD-10-CM | POA: Insufficient documentation

## 2022-08-13 DIAGNOSIS — Z79899 Other long term (current) drug therapy: Secondary | ICD-10-CM | POA: Diagnosis not present

## 2022-08-13 DIAGNOSIS — R1084 Generalized abdominal pain: Secondary | ICD-10-CM

## 2022-08-13 DIAGNOSIS — K52832 Lymphocytic colitis: Secondary | ICD-10-CM | POA: Insufficient documentation

## 2022-08-13 DIAGNOSIS — D120 Benign neoplasm of cecum: Secondary | ICD-10-CM | POA: Diagnosis not present

## 2022-08-13 LAB — URINALYSIS, ROUTINE W REFLEX MICROSCOPIC
Glucose, UA: 100 mg/dL — AB
Hgb urine dipstick: NEGATIVE
Ketones, ur: NEGATIVE mg/dL
Leukocytes,Ua: NEGATIVE
Nitrite: POSITIVE — AB
Protein, ur: 100 mg/dL — AB
Specific Gravity, Urine: >1.03 — ABNORMAL HIGH (ref 1.005–1.030)
pH: 6.5 (ref 5.0–8.0)

## 2022-08-13 LAB — COMPREHENSIVE METABOLIC PANEL
ALT: 32 U/L (ref 0–44)
AST: 33 U/L (ref 15–41)
Albumin: 5.1 g/dL — ABNORMAL HIGH (ref 3.5–5.0)
Alkaline Phosphatase: 301 U/L — ABNORMAL HIGH (ref 38–126)
Anion gap: 16 — ABNORMAL HIGH (ref 5–15)
BUN: 15 mg/dL (ref 6–20)
CO2: 17 mmol/L — ABNORMAL LOW (ref 22–32)
Calcium: 11 mg/dL — ABNORMAL HIGH (ref 8.9–10.3)
Chloride: 100 mmol/L (ref 98–111)
Creatinine, Ser: 2.22 mg/dL — ABNORMAL HIGH (ref 0.44–1.00)
GFR, Estimated: 25 mL/min — ABNORMAL LOW (ref >60)
Glucose, Bld: 137 mg/dL — ABNORMAL HIGH (ref 70–99)
Potassium: 3.7 mmol/L (ref 3.5–5.1)
Sodium: 133 mmol/L — ABNORMAL LOW (ref 135–145)
Total Bilirubin: 1.4 mg/dL — ABNORMAL HIGH (ref 0.3–1.2)
Total Protein: 8.5 g/dL — ABNORMAL HIGH (ref 6.5–8.1)

## 2022-08-13 LAB — CULTURE, BLOOD (ROUTINE X 2)
Culture: NO GROWTH
Culture: NO GROWTH
Special Requests: ADEQUATE

## 2022-08-13 LAB — CBC
HCT: 51.4 % — ABNORMAL HIGH (ref 36.0–46.0)
Hemoglobin: 19.1 g/dL — ABNORMAL HIGH (ref 12.0–15.0)
MCH: 33.8 pg (ref 26.0–34.0)
MCHC: 37.2 g/dL — ABNORMAL HIGH (ref 30.0–36.0)
MCV: 91 fL (ref 80.0–100.0)
Platelets: 447 10*3/uL — ABNORMAL HIGH (ref 150–400)
RBC: 5.65 MIL/uL — ABNORMAL HIGH (ref 3.87–5.11)
RDW: 11.9 % (ref 11.5–15.5)
WBC: 13 10*3/uL — ABNORMAL HIGH (ref 4.0–10.5)
nRBC: 0 % (ref 0.0–0.2)

## 2022-08-13 LAB — LIPASE, BLOOD: Lipase: 520 U/L — ABNORMAL HIGH (ref 11–51)

## 2022-08-13 LAB — URINALYSIS, MICROSCOPIC (REFLEX)

## 2022-08-13 MED ORDER — SODIUM CHLORIDE 0.9 % IV BOLUS
1000.0000 mL | Freq: Once | INTRAVENOUS | Status: AC
  Administered 2022-08-13: 1000 mL via INTRAVENOUS

## 2022-08-13 MED ORDER — SODIUM CHLORIDE 0.9 % IV SOLN: INTRAVENOUS | Status: DC

## 2022-08-13 MED ORDER — MORPHINE SULFATE (PF) 4 MG/ML IV SOLN
4.0000 mg | Freq: Once | INTRAVENOUS | Status: AC
  Administered 2022-08-13: 4 mg via INTRAVENOUS
  Filled 2022-08-13: qty 1

## 2022-08-13 MED ORDER — PANTOPRAZOLE SODIUM 40 MG IV SOLR
40.0000 mg | Freq: Once | INTRAVENOUS | Status: AC
  Administered 2022-08-13: 40 mg via INTRAVENOUS
  Filled 2022-08-13: qty 10

## 2022-08-13 MED ORDER — ONDANSETRON HCL 4 MG/2ML IJ SOLN
4.0000 mg | Freq: Once | INTRAMUSCULAR | Status: AC
  Administered 2022-08-13: 4 mg via INTRAVENOUS
  Filled 2022-08-13: qty 2

## 2022-08-13 NOTE — ED Triage Notes (Signed)
Patient here POV from Home.  Endorses being Admitted and Discharged a few days ago for Vomiting due to Diverticulitis. 12 Hours Post-DC the Patient began to have Persistent N/V since.   Subjective Fevers. No Diarrhea.   NAD Noted during Triage. A&Ox4. GCS 15. Ambulatory.

## 2022-08-13 NOTE — ED Provider Notes (Cosign Needed Addendum)
[facility] EMERGENCY DEPT Provider Note   CSN: [id number] Arrival date & time: 08/13/22  1656     History  Chief Complaint  Patient presents with  . Vomiting    Rachel Weber is a 61 y.o. female with a PMHx of diverticulitis who presents to the ED complaining of emesis onset today.  Has a history of similar symptoms.  Patient was recently discharged from the hospital 08/10/2022 for similar symptoms.  Has a follow-up appointment with her primary care as well as gastroenterology.  Has associated green watery nonbloody diarrhea (for the past 2 days), lower abdominal pain.  Has tried her prescription medications (Tylenol #3, probiotic).  Denies back pain.   The history is provided by the patient. No language interpreter was used.       Home Medications Prior to Admission medications   Medication Sig Start Date End Date Taking? Authorizing Provider  acetaminophen-codeine (TYLENOL #3) 300-30 MG tablet Take 1-2 tablets by mouth See admin instructions. 2 to 3 times daily as needed for pain 01/16/20   [provider]  albuterol (PROVENTIL HFA;VENTOLIN HFA) 108 (90 Base) MCG/ACT inhaler INHALE 1-2 PUFFS INTO THE LUNGS EVERY 4 (FOUR) HOURS AS NEEDED FOR WHEEZING OR SHORTNESS OF BREATH. 07/16/18   [name], MD  LORazepam (ATIVAN) 1 MG tablet Take 1 tablet (1 mg total) by mouth 2 (two) times daily as needed for anxiety. 06/16/18   [name], MD  saccharomyces boulardii (FLORASTOR) 250 MG capsule Take 1 capsule (250 mg total) by mouth 2 (two) times daily for 7 days. 08/10/22 08/17/22  Lavina Hamman, MD  temazepam (RESTORIL) 15 MG capsule Take 15-30 mg by mouth at bedtime as needed for sleep. 07/16/22   [provider]      Allergies    Flagyl [metronidazole], Hydrocodone, and Oxycontin [oxycodone hcl]    Review of Systems   Review of Systems  Gastrointestinal:  Positive for abdominal pain, diarrhea, nausea and vomiting.  All other systems  reviewed and are negative.   Physical Exam Updated Vital Signs BP 106/82 (BP Location: Right Arm)   Pulse 94   Temp (!) 97.5 F (36.4 C)   Resp 18   Ht $R'5\' 9"'dH$  (1.753 m)   Wt 70.9 kg   SpO2 97%   BMI 23.08 kg/m  Physical Exam Vitals and nursing note reviewed.  Constitutional:      General: She is not in acute distress.    Appearance: She is not diaphoretic.  HENT:     Head: Normocephalic and atraumatic.     Mouth/Throat:     Pharynx: No oropharyngeal exudate.  Eyes:     General: No scleral icterus.    Conjunctiva/sclera: Conjunctivae normal.  Cardiovascular:     Rate and Rhythm: Normal rate and regular rhythm.     Pulses: Normal pulses.     Heart sounds: Normal heart sounds.  Pulmonary:     Effort: Pulmonary effort is normal. No respiratory distress.     Breath sounds: Normal breath sounds. No wheezing.  Abdominal:     General: Bowel sounds are normal.     Palpations: Abdomen is soft. There is no mass.     Tenderness: There is generalized abdominal tenderness. There is no guarding or rebound.     Comments: Diffuse abdominal TTP, more so to lower abdominal region.  Musculoskeletal:        General: Normal range of motion.     Cervical back: Normal range of motion and neck  supple.  Skin:    General: Skin is warm and dry.  Neurological:     Mental Status: She is alert.  Psychiatric:        Behavior: Behavior normal.     ED Results / Procedures / Treatments   Labs (all labs ordered are listed, but only abnormal results are displayed) Labs Reviewed  LIPASE, BLOOD - Abnormal; Notable for the following components:      Result Value   Lipase 520 (*)    All other components within normal limits  COMPREHENSIVE METABOLIC PANEL - Abnormal; Notable for the following components:   Sodium 133 (*)    CO2 17 (*)    Glucose, Bld 137 (*)    Creatinine, Ser 2.22 (*)    Calcium 11.0 (*)    Total Protein 8.5 (*)    Albumin 5.1 (*)    Alkaline Phosphatase 301 (*)    Total  Bilirubin 1.4 (*)    GFR, Estimated 25 (*)    Anion gap 16 (*)    All other components within normal limits  CBC - Abnormal; Notable for the following components:   WBC 13.0 (*)    RBC 5.65 (*)    Hemoglobin 19.1 (*)    HCT 51.4 (*)    MCHC 37.2 (*)    Platelets 447 (*)    All other components within normal limits  URINALYSIS, ROUTINE W REFLEX MICROSCOPIC    EKG None  Radiology No results found.  Procedures Procedures    Medications Ordered in ED Medications  morphine (PF) 4 MG/ML injection 4 mg (has no administration in time range)  sodium chloride 0.9 % bolus 1,000 mL (1,000 mLs Intravenous New Bag/Given 08/13/22 1828)  ondansetron (ZOFRAN) injection 4 mg (4 mg Intravenous Given 08/13/22 1828)    ED Course/ Medical Decision Making/ A&P Clinical Course as of 08/13/22 2204  Sat Aug 13, 2022  1911 Discussed with patient lab findings.  Discussed with patient plans for admission.  Answered all available questions.  Patient appears safe for admission at this time. [SB]  2050 Consultation with hospitalist, Dr. Cyd Silence who recommends admission at this time.  Also recommends starting the patient on maintenance fluids as well as a PPI, n.p.o., repeat CMP after fluids.  If GFR increases then obtain CT abdomen pelvis with contrast. [SB]    Clinical Course User Index [SB] Karrin Eisenmenger A, PA-C                           Medical Decision Making Amount and/or Complexity of Data Reviewed Labs: ordered.  Risk Prescription drug management. Decision regarding hospitalization.   Patient presents to the emergency department with lower abdominal pain, nausea, vomiting, diarrhea.  Patient has a history of diverticulitis.  Patient has a follow-up appointment with GI.  Has tried prescription medications without relief.  Patient afebrile.  On exam patient with diffuse abdominal tenderness to palpation, more so to the lower abdominal region.  No acute cardiovascular respiratory exam findings.   Differential diagnosis includes pancreatitis, cholecystitis, appendicitis, acute cystitis, diverticulitis, diverticulosis, colitis.     Additional history obtained:  External records from outside source obtained and reviewed including: Patient was recently admitted to the hospital on 08/08/2022 for similar concerns.  Labs:  I ordered, and personally interpreted labs.  The pertinent results include:  Lipase elevated at 520, decreased on previous values. CBC with leukocytosis at 13. CMP with elevated creatinine at 2.22, elevated alk phos at  301, elevated total bili at 1.4, GFR decreased to 25.  Urinalysis ordered with results pending at time of admission.  Medications:  I ordered medication including IV fluids, Zofran, morphine for symptom management. Reevaluation of the patient after these medicines and interventions, I reevaluated the patient and found that they have improved I have reviewed the patients home medicines and have made adjustments as needed   Consultations: I requested consultation with the Hospitalist, Dr. Cyd Silence and discussed lab and imaging findings as well as pertinent plan - they recommend: Admission to the hospital.  Also recommend PPI, n.p.o., repeat CMP after fluids.  If GFR increases above 30 then obtain CT abdomen pelvis with contrast.    Disposition: Presentation suspicious for AKI.  Also concerning for worsening abdominal pain in the setting of recent diagnosis of diverticulitis. After consideration of the diagnostic results and the patients response to treatment, I feel that the patient would benefit from Admission to the hospital.  Message sent to [facility] GI via secure chat regarding patient's admission.  Discussed with patient plans for admission.  Patient agreeable at this time.  Patient appears safe for admission at this time.   This chart was dictated using voice recognition software, Dragon. Despite the best efforts of this provider to proofread and correct  errors, errors may still occur which can change documentation meaning.   Final Clinical Impression(s) / ED Diagnoses Final diagnoses:  AKI (acute kidney injury) ([place])  Generalized abdominal pain  Elevated LFTs  Elevated lipase    Rx / DC Orders ED Discharge Orders     None         Ivaan Liddy A, PA-C 08/13/22 2121    Cristie Hem, MD 08/13/22 2124    Murrell Elizondo A, PA-C 08/13/22 2204    Cristie Hem, MD 08/14/22 0008

## 2022-08-14 ENCOUNTER — Encounter (HOSPITAL_COMMUNITY): Payer: Self-pay | Admitting: Internal Medicine

## 2022-08-14 DIAGNOSIS — R1011 Right upper quadrant pain: Secondary | ICD-10-CM | POA: Diagnosis not present

## 2022-08-14 DIAGNOSIS — K52832 Lymphocytic colitis: Secondary | ICD-10-CM | POA: Diagnosis not present

## 2022-08-14 DIAGNOSIS — E860 Dehydration: Secondary | ICD-10-CM

## 2022-08-14 DIAGNOSIS — K635 Polyp of colon: Secondary | ICD-10-CM | POA: Diagnosis not present

## 2022-08-14 DIAGNOSIS — K449 Diaphragmatic hernia without obstruction or gangrene: Secondary | ICD-10-CM | POA: Diagnosis not present

## 2022-08-14 DIAGNOSIS — R634 Abnormal weight loss: Secondary | ICD-10-CM | POA: Diagnosis not present

## 2022-08-14 DIAGNOSIS — R7989 Other specified abnormal findings of blood chemistry: Secondary | ICD-10-CM | POA: Diagnosis not present

## 2022-08-14 DIAGNOSIS — R112 Nausea with vomiting, unspecified: Secondary | ICD-10-CM | POA: Diagnosis present

## 2022-08-14 DIAGNOSIS — E8729 Other acidosis: Secondary | ICD-10-CM

## 2022-08-14 DIAGNOSIS — D123 Benign neoplasm of transverse colon: Secondary | ICD-10-CM | POA: Diagnosis not present

## 2022-08-14 DIAGNOSIS — K648 Other hemorrhoids: Secondary | ICD-10-CM | POA: Diagnosis not present

## 2022-08-14 DIAGNOSIS — K2970 Gastritis, unspecified, without bleeding: Secondary | ICD-10-CM | POA: Diagnosis not present

## 2022-08-14 DIAGNOSIS — N179 Acute kidney failure, unspecified: Secondary | ICD-10-CM | POA: Diagnosis not present

## 2022-08-14 DIAGNOSIS — Z79899 Other long term (current) drug therapy: Secondary | ICD-10-CM | POA: Diagnosis not present

## 2022-08-14 DIAGNOSIS — D120 Benign neoplasm of cecum: Secondary | ICD-10-CM | POA: Diagnosis not present

## 2022-08-14 DIAGNOSIS — F17210 Nicotine dependence, cigarettes, uncomplicated: Secondary | ICD-10-CM | POA: Diagnosis not present

## 2022-08-14 DIAGNOSIS — R197 Diarrhea, unspecified: Secondary | ICD-10-CM | POA: Diagnosis not present

## 2022-08-14 DIAGNOSIS — F411 Generalized anxiety disorder: Secondary | ICD-10-CM

## 2022-08-14 DIAGNOSIS — K5730 Diverticulosis of large intestine without perforation or abscess without bleeding: Secondary | ICD-10-CM | POA: Diagnosis not present

## 2022-08-14 LAB — COMPREHENSIVE METABOLIC PANEL
ALT: 26 U/L (ref 0–44)
AST: 31 U/L (ref 15–41)
Albumin: 3.8 g/dL (ref 3.5–5.0)
Alkaline Phosphatase: 202 U/L — ABNORMAL HIGH (ref 38–126)
Anion gap: 10 (ref 5–15)
BUN: 15 mg/dL (ref 6–20)
CO2: 22 mmol/L (ref 22–32)
Calcium: 9.2 mg/dL (ref 8.9–10.3)
Chloride: 105 mmol/L (ref 98–111)
Creatinine, Ser: 1 mg/dL (ref 0.44–1.00)
GFR, Estimated: >60 mL/min (ref >60)
Glucose, Bld: 106 mg/dL — ABNORMAL HIGH (ref 70–99)
Potassium: 3.4 mmol/L — ABNORMAL LOW (ref 3.5–5.1)
Sodium: 137 mmol/L (ref 135–145)
Total Bilirubin: 1.1 mg/dL (ref 0.3–1.2)
Total Protein: 6.2 g/dL — ABNORMAL LOW (ref 6.5–8.1)

## 2022-08-14 MED ORDER — LORAZEPAM 1 MG PO TABS
1.0000 mg | ORAL_TABLET | Freq: Every day | ORAL | Status: AC | PRN
  Administered 2022-08-14: 1 mg via ORAL
  Filled 2022-08-14: qty 1

## 2022-08-14 MED ORDER — TEMAZEPAM 15 MG PO CAPS
15.0000 mg | ORAL_CAPSULE | Freq: Once | ORAL | Status: AC
  Administered 2022-08-14: 15 mg via ORAL
  Filled 2022-08-14: qty 1

## 2022-08-14 MED ORDER — LORAZEPAM 2 MG/ML IJ SOLN: 0.5000 mg | Freq: Four times a day (QID) | INTRAMUSCULAR | Status: DC | PRN

## 2022-08-14 MED ORDER — HYDROMORPHONE HCL 1 MG/ML IJ SOLN
0.5000 mg | INTRAMUSCULAR | Status: DC | PRN
  Administered 2022-08-14 – 2022-08-15 (×7): 0.5 mg via INTRAVENOUS
  Filled 2022-08-14: qty 0.5
  Filled 2022-08-14: qty 1
  Filled 2022-08-14: qty 0.5
  Filled 2022-08-14: qty 1
  Filled 2022-08-14: qty 0.5
  Filled 2022-08-14 (×2): qty 1

## 2022-08-14 MED ORDER — ACETAMINOPHEN 650 MG RE SUPP: 650.0000 mg | Freq: Four times a day (QID) | RECTAL | Status: DC | PRN

## 2022-08-14 MED ORDER — TEMAZEPAM 15 MG PO CAPS: 15.0000 mg | ORAL_CAPSULE | Freq: Every evening | ORAL | Status: DC | PRN

## 2022-08-14 MED ORDER — ACETAMINOPHEN 325 MG PO TABS: 650.0000 mg | ORAL_TABLET | Freq: Four times a day (QID) | ORAL | Status: DC | PRN

## 2022-08-14 MED ORDER — ONDANSETRON HCL 4 MG/2ML IJ SOLN: 4.0000 mg | Freq: Four times a day (QID) | INTRAMUSCULAR | Status: DC | PRN

## 2022-08-14 MED ORDER — SODIUM CHLORIDE 0.9 % IV SOLN
1.0000 g | Freq: Once | INTRAVENOUS | Status: AC
  Administered 2022-08-14: 1 g via INTRAVENOUS
  Filled 2022-08-14: qty 10

## 2022-08-14 NOTE — ED Notes (Addendum)
Spoke with patient and husband regarding admission to [facility] hospital vs [facility]. Due to past experience at [facility] patient does not want to be admitted to [facility]. Patient and husband will go to [facility] ED and wait for admission. Spoke in detail with patient regarding options. Husband is on his way here. Will get updated vitals and review pain. Admitting MD [place] made aware.

## 2022-08-14 NOTE — ED Notes (Signed)
Admitting MD--patient will need to go to [facility] for bed placement. Patient ambulating to restroom at this time. When returns will speak with patient about plan of care and update admitting.

## 2022-08-14 NOTE — ED Notes (Signed)
Patient updated on new plan of care---will add patient to [facility] admission bed list and patient will hold at [facility] ED. Patient and husband very pleased with this plan. Patients vitals updated and pain reassessed.

## 2022-08-14 NOTE — ED Notes (Signed)
Patient up to restroom to brush teeth and use restroom, updated on POC

## 2022-08-14 NOTE — ED Notes (Signed)
This nurse has assumed care-- pt aroused by reporting staff and moved to new holding room via stretcher- pt now asking for pain medication; requests Dilaudid specifically - denies active nausea at this time while eating ice chips.  ED provider, Dr. Leonette Monarch, made aware of pt request for IVP Dilaudid (see MAR for med order and administration)

## 2022-08-14 NOTE — ED Notes (Signed)
Patient up to use restroom

## 2022-08-14 NOTE — ED Notes (Signed)
Patient back on monitor, husband at bedside will try some broth. No needs.

## 2022-08-14 NOTE — Progress Notes (Signed)
Plan of Care Note for accepted transfer   Patient: Rachel Weber MRN: [id number]   DOA: 08/13/2022  Facility requesting transfer: [facility] Requesting Provider:  Reason for transfer: Abdominal Pain, Vomiting and Diarrhea Facility course:   61 year old female with past medical history of anxiety disorder nicotine dependence who presented to [facility] emergency department with complaints of nausea vomiting abdominal pain and diarrhea.  Of note, patient who has recently been hospitalized at [facility] long hospital from 8/16 until 8/17 for similar symptoms.  Patient was initially diagnosed with diverticulitis and started on intravenous Zosyn patient was initially found to have derangement of her LFTs at that time.  Unfortunately the patient left AGAINST MEDICAL ADVICE on 8/17.  Patient returned and was readmitted to [facility] on 8/22 due to persisting symptoms.  Patient was placed back on intravenous antibiotics for diverticulitis and during that hospital stay an MRCP was obtained revealing intra and extrahepatic biliary ductal dilatation.  Patient was evaluated by gastroenterology and patient was eventually discharged home on 8/23 with the intent of following up with [facility] gastroenterology in the outpatient setting for further work-up.    Unfortunately patient presents back to [facility] with recurrent symptoms of nausea vomiting abdominal pain and diarrhea.   On this repeat evaluation patient was found to have recurrent acute kidney injury with creatinine of 2.22 and continued derangement of patient's lipase and hepatic function panel.  Due to patient's ongoing symptoms and deranged hepatic function panel EDP is requesting hospitalization for further evaluation and gastroenterology consultation.  EDP did initiate the patient on intravenous fluids and sent a secure chat to Dr. Paulita Fujita with [facility] gastroenterology.  Bed request placed for [place] bed at patient request.  Plan  of care: The patient is accepted for admission to Telemetry unit, at [facility]..   Author: Vernelle Emerald, MD 08/14/2022  Check [email or web address] for on-call coverage.  Nursing staff, Please call [place] number on Amion as soon as patient's arrival, so appropriate admitting provider can evaluate the pt.

## 2022-08-15 ENCOUNTER — Inpatient Hospital Stay: Admission: RE | Admit: 2022-08-15 | Payer: BC Managed Care – PPO | Source: Ambulatory Visit

## 2022-08-15 DIAGNOSIS — N179 Acute kidney failure, unspecified: Secondary | ICD-10-CM | POA: Diagnosis present

## 2022-08-15 DIAGNOSIS — E860 Dehydration: Secondary | ICD-10-CM | POA: Diagnosis present

## 2022-08-15 DIAGNOSIS — E8729 Other acidosis: Secondary | ICD-10-CM | POA: Diagnosis present

## 2022-08-15 DIAGNOSIS — R112 Nausea with vomiting, unspecified: Secondary | ICD-10-CM | POA: Diagnosis present

## 2022-08-15 DIAGNOSIS — R7989 Other specified abnormal findings of blood chemistry: Secondary | ICD-10-CM | POA: Diagnosis not present

## 2022-08-15 LAB — CBC WITH DIFFERENTIAL/PLATELET
Abs Immature Granulocytes: 0.04 10*3/uL (ref 0.00–0.07)
Basophils Absolute: 0 10*3/uL (ref 0.0–0.1)
Basophils Relative: 0 %
Eosinophils Absolute: 0.1 10*3/uL (ref 0.0–0.5)
Eosinophils Relative: 2 %
HCT: 35.1 % — ABNORMAL LOW (ref 36.0–46.0)
Hemoglobin: 12.6 g/dL (ref 12.0–15.0)
Immature Granulocytes: 1 %
Lymphocytes Relative: 24 %
Lymphs Abs: 2 10*3/uL (ref 0.7–4.0)
MCH: 34.3 pg — ABNORMAL HIGH (ref 26.0–34.0)
MCHC: 35.9 g/dL (ref 30.0–36.0)
MCV: 95.6 fL (ref 80.0–100.0)
Monocytes Absolute: 0.8 10*3/uL (ref 0.1–1.0)
Monocytes Relative: 10 %
Neutro Abs: 5.2 10*3/uL (ref 1.7–7.7)
Neutrophils Relative %: 63 %
Platelets: 281 10*3/uL (ref 150–400)
RBC: 3.67 MIL/uL — ABNORMAL LOW (ref 3.87–5.11)
RDW: 12 % (ref 11.5–15.5)
WBC: 8.2 10*3/uL (ref 4.0–10.5)
nRBC: 0 % (ref 0.0–0.2)

## 2022-08-15 LAB — COMPREHENSIVE METABOLIC PANEL
ALT: 24 U/L (ref 0–44)
AST: 30 U/L (ref 15–41)
Albumin: 2.7 g/dL — ABNORMAL LOW (ref 3.5–5.0)
Alkaline Phosphatase: 175 U/L — ABNORMAL HIGH (ref 38–126)
Anion gap: 7 (ref 5–15)
BUN: 9 mg/dL (ref 6–20)
CO2: 21 mmol/L — ABNORMAL LOW (ref 22–32)
Calcium: 8.3 mg/dL — ABNORMAL LOW (ref 8.9–10.3)
Chloride: 112 mmol/L — ABNORMAL HIGH (ref 98–111)
Creatinine, Ser: 0.69 mg/dL (ref 0.44–1.00)
GFR, Estimated: >60 mL/min (ref >60)
Glucose, Bld: 95 mg/dL (ref 70–99)
Potassium: 3.5 mmol/L (ref 3.5–5.1)
Sodium: 140 mmol/L (ref 135–145)
Total Bilirubin: 0.7 mg/dL (ref 0.3–1.2)
Total Protein: 4.7 g/dL — ABNORMAL LOW (ref 6.5–8.1)

## 2022-08-15 LAB — GAMMA GT: GGT: 274 U/L — ABNORMAL HIGH (ref 7–50)

## 2022-08-15 LAB — MAGNESIUM: Magnesium: 1.7 mg/dL (ref 1.7–2.4)

## 2022-08-15 LAB — BILIRUBIN, DIRECT: Bilirubin, Direct: 0.2 mg/dL (ref 0.0–0.2)

## 2022-08-15 LAB — FERRITIN: Ferritin: 96 ng/mL (ref 11–307)

## 2022-08-15 LAB — LACTATE DEHYDROGENASE: LDH: 97 U/L — ABNORMAL LOW (ref 98–192)

## 2022-08-15 LAB — PROTIME-INR
INR: 1.1 (ref 0.8–1.2)
Prothrombin Time: 14.3 seconds (ref 11.4–15.2)

## 2022-08-15 LAB — LIPASE, BLOOD: Lipase: 264 U/L — ABNORMAL HIGH (ref 11–51)

## 2022-08-15 MED ORDER — TRAMADOL HCL 50 MG PO TABS
100.0000 mg | ORAL_TABLET | Freq: Four times a day (QID) | ORAL | Status: DC | PRN
  Administered 2022-08-15 (×3): 100 mg via ORAL
  Filled 2022-08-15 (×3): qty 2

## 2022-08-15 MED ORDER — PEG 3350-KCL-NA BICARB-NACL 420 G PO SOLR
4000.0000 mL | Freq: Once | ORAL | Status: AC
  Administered 2022-08-15: 4000 mL via ORAL
  Filled 2022-08-15: qty 4000

## 2022-08-15 MED ORDER — TEMAZEPAM 15 MG PO CAPS
15.0000 mg | ORAL_CAPSULE | Freq: Once | ORAL | Status: AC
  Administered 2022-08-15: 15 mg via ORAL
  Filled 2022-08-15: qty 1

## 2022-08-15 MED ORDER — POTASSIUM CHLORIDE 10 MEQ/100ML IV SOLN
10.0000 meq | INTRAVENOUS | Status: AC
  Administered 2022-08-15 (×4): 10 meq via INTRAVENOUS
  Filled 2022-08-15 (×4): qty 100

## 2022-08-15 NOTE — Anesthesia Preprocedure Evaluation (Signed)
Anesthesia Evaluation  Patient identified by MRN, date of birth, ID band Patient awake    Reviewed: Allergy & Precautions, H&P , NPO status , Patient's Chart, lab work & pertinent test results  Airway Mallampati: I  TM Distance: >3 FB Neck ROM: Full    Dental no notable dental hx. (+) Teeth Intact, Dental Advisory Given, Missing, Caps, Implants,    Pulmonary neg pulmonary ROS, Current Smoker,    Pulmonary exam normal breath sounds clear to auscultation       Cardiovascular Exercise Tolerance: Good negative cardio ROS Normal cardiovascular exam Rhythm:Regular Rate:Normal     Neuro/Psych PSYCHIATRIC DISORDERS Anxiety negative neurological ROS  negative psych ROS   GI/Hepatic negative GI ROS, Neg liver ROS,   Endo/Other  negative endocrine ROS  Renal/GU ARFRenal diseasenegative Renal ROS  negative genitourinary   Musculoskeletal negative musculoskeletal ROS (+) Arthritis , Osteoarthritis,    Abdominal   Peds negative pediatric ROS (+)  Hematology negative hematology ROS (+)   Anesthesia Other Findings   Reproductive/Obstetrics negative OB ROS                           Anesthesia Physical Anesthesia Plan  ASA: 3  Anesthesia Plan: MAC   Post-op Pain Management: Minimal or no pain anticipated   Induction: Intravenous  PONV Risk Score and Plan: Propofol infusion  Airway Management Planned: Mask and Natural Airway  Additional Equipment: None  Intra-op Plan:   Post-operative Plan:   Informed Consent: I have reviewed the patients History and Physical, chart, labs and discussed the procedure including the risks, benefits and alternatives for the proposed anesthesia with the patient or authorized representative who has indicated his/her understanding and acceptance.       Plan Discussed with: Anesthesiologist  Anesthesia Plan Comments: (HPI: Rachel Weber is a 61 y.o. female  with medical history significant for generalized anxiety disorder, who is admitted to [facility] on 08/13/2022 by way of transfer from [facility] emergency department for further evaluation and management elevated liver enzymes presenting from home to the latter facility complaining of intractable nausea/vomiting.)        Anesthesia Quick Evaluation

## 2022-08-15 NOTE — Progress Notes (Signed)
PROGRESS NOTE    Rachel Weber  GUY:[id number] DOB: Oct 01, 1961 DOA: 08/13/2022 PCP: Mckinley Jewel, MD   Brief Narrative:  Rachel Weber is a 61 y.o. female with medical history significant for generalized anxiety disorder, who is admitted to [facility] on 08/13/2022 by way of transfer from [facility] emergency department for further evaluation and management elevated liver enzymes presenting from home to the latter facility complaining of intractable nausea/vomiting.   Recently presented to the outpatient ED on the 13th discharged on oral antibiotics and subsequently hospitalized for similar episode x2 from 16-17th where she left AMA and presented again to our facility from 22-23rd noted to have diverticulitis initially placed on antibiotic therapy - further imaging showed dilated biliary system on MRCP without stone -presumed to have passed stone during interim timeframe between onset of symptoms and imaging.  Patient's symptoms had resolved, as such patient was discharged for outpatient follow up for colonoscopy with GI once acute inflammation had decreased.  Patient represents to our facility again with worsening abdominal pain nausea and poor p.o. intake.  GI consulted and hospitalist called for admission.   Assessment & Plan:   Principal Problem:   Elevated LFTs Active Problems:   GAD (generalized anxiety disorder)   Abdominal pain   Nausea & vomiting   Hypercalcemia   AKI (acute kidney injury) ([place])   Dehydration   High anion gap metabolic acidosis   Subacute/recurrent intractable nausea and abdominal pain of unclear etiology -GI following, appreciate insight recommendations -Labs continue to downtrend appropriately, CMP is essentially normal, lipase is downtrending as well; on paper patient should have resolved at this point -Autoimmune panel pending per GI -Plan for upper/lower endoscopy plan and 08/16/2022 -Diet per GI -IV fluids ongoing -Discontinue  IV narcotics, IV benzodiazepines - discussed with patient transitioning to p.o. analgesics given no further indication for IV narcotics at this time.  Patient reports allergies to both hydrocodone and oxycodone and forms of hallucinations which we discussed was all the more reason to avoid IV narcotics and avoid any further complications.  AKI secondary to above, resolved Anion gap metabolic acidosis, resolved Multiple electrolyte abnormalities, resolved -Continue IV fluids, labs already resolved, back to baseline at this point -Electrolytes resolving, likely hemoconcentration given poor p.o. intake as above    DVT prophylaxis: Early ambulation, SCDs, low risk Code Status: DNR Family Communication: None present  Status is: Observation  Dispo: The patient is from: Home              Anticipated d/c is to: Home              Anticipated d/c date is: 24 to 48 hours pending GI work-up              Patient currently not medically stable for discharge given ongoing need for endoscopy  Consultants:  GI  Procedures:  Upper lower endoscopy  Antimicrobials:  None  Subjective: No acute issues or events overnight pain currently well controlled denies current nausea vomiting diarrhea constipation headache fevers chills or chest pain.  Objective: Vitals:   08/14/22 2102 08/15/22 0500 08/15/22 0543 08/15/22 0558  BP: 99/64  93/77 103/66  Pulse: 69  79 72  Resp: '18  17 18  '$ Temp: 97.7 F (36.5 C)  97.6 F (36.4 C) 98.1 F (36.7 C)  TempSrc: Oral  Oral Oral  SpO2: 98%  100% 98%  Weight:  73.1 kg    Height:        Intake/Output Summary (Last 24  hours) at 08/15/2022 0749 Last data filed at 08/15/2022 0150 Gross per 24 hour  Intake 120 ml  Output --  Net 120 ml   Filed Weights   08/13/22 1714 08/14/22 1818 08/15/22 0500  Weight: 70.9 kg 72.6 kg 73.1 kg    Examination:  General:  Pleasantly resting in bed, No acute distress. HEENT:  Normocephalic atraumatic.  Sclerae nonicteric,  noninjected.  Extraocular movements intact bilaterally. Neck:  Without mass or deformity.  Trachea is midline. Lungs:  Clear to auscultate bilaterally without rhonchi, wheeze, or rales. Heart:  Regular rate and rhythm.  Without murmurs, rubs, or gallops. Abdomen:  Soft, nontender, nondistended.  Without guarding or rebound. Extremities: Without cyanosis, clubbing, edema, or obvious deformity. Vascular:  Dorsalis pedis and posterior tibial pulses palpable bilaterally. Skin:  Warm and dry, no erythema  Data Reviewed: I have personally reviewed following labs and imaging studies  CBC: Recent Labs  Lab 08/08/22 1413 08/09/22 0500 08/10/22 0539 08/13/22 1716 08/15/22 0145  WBC 11.7* 8.0 7.8 13.0* 8.2  NEUTROABS  --  5.4  --   --  5.2  HGB 17.9* 14.7 14.8 19.1* 12.6  HCT 48.5* 40.1 40.5 51.4* 35.1*  MCV 91.5 93.3 94.2 91.0 95.6  PLT 390 300 360 447* 962   Basic Metabolic Panel: Recent Labs  Lab 08/08/22 1843 08/08/22 2200 08/09/22 0500 08/10/22 0539 08/13/22 1716 08/14/22 0955 08/15/22 0145  NA  --    < > 135 136 133* 137 140  K  --    < > 3.2* 3.9 3.7 3.4* 3.5  CL  --    < > 104 108 100 105 112*  CO2  --    < > 21* 21* 17* 22 21*  GLUCOSE  --    < > 98 89 137* 106* 95  BUN  --    < > '9 6 15 15 9  '$ CREATININE  --    < > 0.75 0.71 2.22* 1.00 0.69  CALCIUM  --    < > 9.1 9.5 11.0* 9.2 8.3*  MG 1.8  --   --   --   --   --  1.7   < > = values in this interval not displayed.   GFR: Estimated Creatinine Clearance: 79.1 mL/min (by C-G formula based on SCr of 0.69 mg/dL). Liver Function Tests: Recent Labs  Lab 08/09/22 0500 08/10/22 0539 08/13/22 1716 08/14/22 0955 08/15/22 0145  AST 38 42* 33 31 30  ALT 38 40 32 26 24  ALKPHOS 306* 277* 301* 202* 175*  BILITOT 1.2 0.9 1.4* 1.1 0.7  PROT 6.4* 6.3* 8.5* 6.2* 4.7*  ALBUMIN 3.8 3.6 5.1* 3.8 2.7*   Recent Labs  Lab 08/08/22 1413 08/13/22 1716 08/15/22 0145  LIPASE 553* 520* 264*   No results for input(s): "AMMONIA"  in the last 168 hours. Coagulation Profile: Recent Labs  Lab 08/15/22 0645  INR 1.1   Cardiac Enzymes: Recent Labs  Lab 08/08/22 1843  CKTOTAL 46   BNP (last 3 results) No results for input(s): "PROBNP" in the last 8760 hours. HbA1C: No results for input(s): "HGBA1C" in the last 72 hours. CBG: No results for input(s): "GLUCAP" in the last 168 hours. Lipid Profile: No results for input(s): "CHOL", "HDL", "LDLCALC", "TRIG", "CHOLHDL", "LDLDIRECT" in the last 72 hours. Thyroid Function Tests: No results for input(s): "TSH", "T4TOTAL", "FREET4", "T3FREE", "THYROIDAB" in the last 72 hours. Anemia Panel: No results for input(s): "VITAMINB12", "FOLATE", "FERRITIN", "TIBC", "IRON", "RETICCTPCT" in the last 72  hours. Sepsis Labs: Recent Labs  Lab 08/08/22 1648  LATICACIDVEN 1.1    Recent Results (from the past 240 hour(s))  Blood culture (routine x 2)     Status: None   Collection Time: 08/08/22  4:45 PM   Specimen: BLOOD  Result Value Ref Range Status   Specimen Description   Final    BLOOD BLOOD RIGHT ARM Performed at [facility], [address], [place], [place] [address]    Special Requests   Final    BOTTLES DRAWN AEROBIC AND ANAEROBIC Blood Culture adequate volume   Culture   Final    NO GROWTH 5 DAYS Performed at [facility], [place] [address], [place], [place] [address]    Report Status 08/13/2022 FINAL  Final  Blood culture (routine x 2)     Status: None   Collection Time: 08/08/22  4:50 PM   Specimen: BLOOD  Result Value Ref Range Status   Specimen Description   Final    BLOOD LEFT ANTECUBITAL Performed at [facility], [address] [place], [place] [address]    Special Requests   Final    BOTTLES DRAWN AEROBIC AND ANAEROBIC Blood Culture results may not be optimal due to an excessive volume of blood received in culture bottles   Culture   Final    NO GROWTH 5 DAYS Performed at [facility], [place] [address] [place],  [address]    Report Status 08/13/2022 FINAL  Final   Radiology Studies: No results found.  Scheduled Meds: Continuous Infusions:  sodium chloride 125 mL/hr at 08/15/22 0148   potassium chloride 10 mEq (08/15/22 0715)     LOS: 0 days   Time spent: 10mn  Alexzandra Bilton C Hendrix Yurkovich, DO [facility]  If 7PM-7AM, please contact night-coverage [email or web address]  08/15/2022, 7:49 AM

## 2022-08-15 NOTE — Discharge Summary (Signed)
Physician Discharge Summary  Rachel Weber XNT:[id number] DOB: 1961/07/13 DOA: 08/13/2022  PCP: Mckinley Jewel, MD  Admit date: 08/13/2022 Discharge date: 08/16/2022  Admitted From: Home Disposition: Home  Recommendations for Outpatient Follow-up:  Follow up with PCP in 1-2 weeks Follow-up with GI as scheduled  Home Health: None Equipment/Devices: None  Discharge Condition: Stable CODE STATUS: DNR Diet recommendation: Low-salt low-fat bland soft diet  Brief/Interim Summary: Rachel Weber is a 61 y.o. female with medical history significant for generalized anxiety disorder, who is admitted to [facility] on 08/13/2022 by way of transfer from [facility] emergency department for further evaluation and management elevated liver enzymes presenting from home to the latter facility complaining of intractable nausea/vomiting.    Recently presented to the outpatient ED on the 13th discharged on oral antibiotics and subsequently hospitalized for similar episode x2 from 16-17th where she left AMA and presented again to our facility from 22-23rd noted to have diverticulitis initially placed on antibiotic therapy - further imaging showed dilated biliary system on MRCP without stone -presumed to have passed stone during interim timeframe between onset of symptoms and imaging.  Patient's symptoms had resolved, as such patient was discharged for outpatient follow up for colonoscopy with GI once acute inflammation had decreased.   Patient represents to our facility again with worsening abdominal pain nausea and poor p.o. intake.  GI consulted and hospitalist called for admission.  Patient underwent both upper and lower endoscopy with GI without any overt findings to explain patient's pathology.  At this time resume diet per GI, brushings and biopsies taken, pathology pending -this will be an outpatient discussion per GI.  Repeat colonoscopy pending pathology.  Patient otherwise stable and  agreeable for discharge home, symptoms have resolved otherwise close follow-up outpatient with PCP and GI as discussed  Discharge Diagnoses:  Principal Problem:   Elevated LFTs Active Problems:   GAD (generalized anxiety disorder)   Abdominal pain   Nausea & vomiting   Hypercalcemia   AKI (acute kidney injury) ([place])   Dehydration   High anion gap metabolic acidosis    Discharge Instructions   Allergies as of 08/16/2022       Reactions   Flagyl [metronidazole] Nausea And Vomiting   Hydrocodone Other (See Comments)   Hallucinations   Oxycontin [oxycodone Hcl] Nausea And Vomiting, Other (See Comments)   Hallucinations        Medication List     STOP taking these medications    LORazepam 1 MG tablet Commonly known as: ATIVAN   saccharomyces boulardii 250 MG capsule Commonly known as: Florastor       TAKE these medications    acetaminophen-codeine 300-30 MG tablet Commonly known as: TYLENOL #3 Take 1-2 tablets by mouth See admin instructions. Take 1-2 tablets by mouth 2 to 3 times daily as needed for pain   albuterol 108 (90 Base) MCG/ACT inhaler Commonly known as: VENTOLIN HFA INHALE 1-2 PUFFS INTO THE LUNGS EVERY 4 (FOUR) HOURS AS NEEDED FOR WHEEZING OR SHORTNESS OF BREATH.   Banophen 25 mg capsule Generic drug: diphenhydrAMINE Take 25 mg by mouth 2 (two) times daily as needed for allergies.   ondansetron 4 MG tablet Commonly known as: Zofran Take 1 tablet (4 mg total) by mouth daily as needed for nausea or vomiting.   temazepam 15 MG capsule Commonly known as: RESTORIL Take 15-30 mg by mouth at bedtime as needed for sleep.        Allergies  Allergen Reactions   Flagyl [Metronidazole]  Nausea And Vomiting   Hydrocodone Other (See Comments)    Hallucinations   Oxycontin [Oxycodone Hcl] Nausea And Vomiting and Other (See Comments)    Hallucinations    Consultations: GI  Procedures/Studies: MR ABDOMEN MRCP W WO CONTAST  Result Date:  08/09/2022 CLINICAL DATA:  Epigastric abdominal pain. EXAM: MRI ABDOMEN WITHOUT AND WITH CONTRAST (INCLUDING MRCP) TECHNIQUE: Multiplanar multisequence MR imaging of the abdomen was performed both before and after the administration of intravenous contrast. Heavily T2-weighted images of the biliary and pancreatic ducts were obtained, and three-dimensional MRCP images were rendered by post processing. CONTRAST:  38m GADAVIST GADOBUTROL 1 MMOL/ML IV SOLN COMPARISON:  Multiple prior imaging studies including most recent CT abdomen pelvis dated August 08, 2022 and most remote CT dated July 31, 2022. FINDINGS: Lower chest: Consolidation versus atelectasis in the right lung base. Hepatobiliary: No significant hepatic steatosis. No suspicious hepatic lesion. Gallbladder is distended without gallbladder wall thickening or pericholecystic fluid. There is dilation of the intra and extrahepatic biliary tree with the common duct measuring up to 12 mm at the level of the pancreatic head with gentle tapering of the duct to the ampulla. No evidence of extrinsic compression, choledocholithiasis or ampullary head mass identified. No peribiliary enhancement. Pancreas: Intrinsic T1 signal of the pancreatic parenchyma is within normal limits. Homogeneous postcontrast enhancement of the pancreas. No pancreatic ductal dilation. No pancreatic divisum. No cystic or solid hyperenhancing pancreatic lesion identified. Spleen:  No splenomegaly or focal splenic lesion. Adrenals/Urinary Tract: Bilateral adrenal glands appear normal. No hydronephrosis. No solid enhancing renal mass. Stomach/Bowel: Visualized portions within the abdomen are unremarkable. Vascular/Lymphatic: No pathologically enlarged lymph nodes identified. No abdominal aortic aneurysm demonstrated. Other:  No significant abdominal free fluid. Musculoskeletal: No suspicious bone lesions identified. IMPRESSION: 1. Intra and extrahepatic biliary ductal dilation with the common  duct measuring up to 12 mm and distal tapering of the duct to the level of the ampulla, no evidence of extrinsic compression, choledocholithiasis or ampullary head mass. No peribiliary enhancement or pancreatic ductal dilation. Nonspecific findings possibly reflecting sequela of a recently passed gallstone although there are no cholelithiasis visualized currently in the gallbladder. Pertinent alternate considerations in a patient with an intermittent pattern of cholestatic laboratory values would include sclerosing or autoimmune cholangitis. No ductal stricture/beading or peribiliary enhancement identified on this examination. 2. Gallbladder is distended without gallbladder wall thickening or pericholecystic fluid possibly reflecting fasting state. 3. Consolidation versus atelectasis in the right lower lobe. Electronically Signed   By: JDahlia BailiffM.D.   On: 08/09/2022 18:15   MR 3D Recon At Scanner  Result Date: 08/09/2022 CLINICAL DATA:  Epigastric abdominal pain. EXAM: MRI ABDOMEN WITHOUT AND WITH CONTRAST (INCLUDING MRCP) TECHNIQUE: Multiplanar multisequence MR imaging of the abdomen was performed both before and after the administration of intravenous contrast. Heavily T2-weighted images of the biliary and pancreatic ducts were obtained, and three-dimensional MRCP images were rendered by post processing. CONTRAST:  723mGADAVIST GADOBUTROL 1 MMOL/ML IV SOLN COMPARISON:  Multiple prior imaging studies including most recent CT abdomen pelvis dated August 08, 2022 and most remote CT dated July 31, 2022. FINDINGS: Lower chest: Consolidation versus atelectasis in the right lung base. Hepatobiliary: No significant hepatic steatosis. No suspicious hepatic lesion. Gallbladder is distended without gallbladder wall thickening or pericholecystic fluid. There is dilation of the intra and extrahepatic biliary tree with the common duct measuring up to 12 mm at the level of the pancreatic head with gentle tapering of  the duct to the ampulla.  No evidence of extrinsic compression, choledocholithiasis or ampullary head mass identified. No peribiliary enhancement. Pancreas: Intrinsic T1 signal of the pancreatic parenchyma is within normal limits. Homogeneous postcontrast enhancement of the pancreas. No pancreatic ductal dilation. No pancreatic divisum. No cystic or solid hyperenhancing pancreatic lesion identified. Spleen:  No splenomegaly or focal splenic lesion. Adrenals/Urinary Tract: Bilateral adrenal glands appear normal. No hydronephrosis. No solid enhancing renal mass. Stomach/Bowel: Visualized portions within the abdomen are unremarkable. Vascular/Lymphatic: No pathologically enlarged lymph nodes identified. No abdominal aortic aneurysm demonstrated. Other:  No significant abdominal free fluid. Musculoskeletal: No suspicious bone lesions identified. IMPRESSION: 1. Intra and extrahepatic biliary ductal dilation with the common duct measuring up to 12 mm and distal tapering of the duct to the level of the ampulla, no evidence of extrinsic compression, choledocholithiasis or ampullary head mass. No peribiliary enhancement or pancreatic ductal dilation. Nonspecific findings possibly reflecting sequela of a recently passed gallstone although there are no cholelithiasis visualized currently in the gallbladder. Pertinent alternate considerations in a patient with an intermittent pattern of cholestatic laboratory values would include sclerosing or autoimmune cholangitis. No ductal stricture/beading or peribiliary enhancement identified on this examination. 2. Gallbladder is distended without gallbladder wall thickening or pericholecystic fluid possibly reflecting fasting state. 3. Consolidation versus atelectasis in the right lower lobe. Electronically Signed   By: Dahlia Bailiff M.D.   On: 08/09/2022 18:15   CT ABDOMEN PELVIS W CONTRAST  Result Date: 08/08/2022 CLINICAL DATA:  Left lower quadrant abdominal pain EXAM: CT ABDOMEN  AND PELVIS WITH CONTRAST TECHNIQUE: Multidetector CT imaging of the abdomen and pelvis was performed using the standard protocol following bolus administration of intravenous contrast. RADIATION DOSE REDUCTION: This exam was performed according to the departmental dose-optimization program which includes automated exposure control, adjustment of the mA and/or kV according to patient size and/or use of iterative reconstruction technique. CONTRAST:  32m OMNIPAQUE IOHEXOL 300 MG/ML  SOLN COMPARISON:  CT abdomen and pelvis dated August 03, 2022 FINDINGS: Lower chest: No acute abnormality. Hepatobiliary: No focal liver abnormality is seen. Mild intrahepatic biliary ductal dilation and moderately dilated common bile duct, similar to prior. Gallbladder is unremarkable. Pancreas: Unremarkable. No pancreatic ductal dilatation or surrounding inflammatory changes. Spleen: Normal in size without focal abnormality. Adrenals/Urinary Tract: Adrenal glands are unremarkable. Kidneys are normal, without renal calculi, focal lesion, or hydronephrosis. Bladder is unremarkable. Stomach/Bowel: Stomach is within normal limits. Appendix appears normal. Decreased wall thickening and inflammatory change of the sigmoid colon. Mild diverticulosis. No evidence of obstruction. Vascular/Lymphatic: Aortic atherosclerosis. No enlarged abdominal or pelvic lymph nodes. Reproductive: Uterus and bilateral adnexa are unremarkable. Other: No abdominal wall hernia or abnormality. No abdominopelvic ascites. Musculoskeletal: No acute or significant osseous findings. IMPRESSION: 1. Decreased wall thickening and inflammatory change of the sigmoid colon, compatible with resolving acute diverticulitis. 2. Mild intrahepatic biliary ductal dilation and moderate dilation of the common bile duct, unchanged when compared with recent prior CTs. Given history of elevated LFTs, recommend MRCP for further evaluation. 3.  Aortic Atherosclerosis (ICD10-I70.0).  Electronically Signed   By: LYetta GlassmanM.D.   On: 08/08/2022 17:40   UKoreaAbdomen Limited RUQ (LIVER/GB)  Result Date: 08/03/2022 CLINICAL DATA:  61year old female with elevated liver function tests. EXAM: ULTRASOUND ABDOMEN LIMITED RIGHT UPPER QUADRANT COMPARISON:  CT abdomen pelvis from the same day. FINDINGS: Gallbladder: Mildly distended with layering sludge. No gallbladder wall thickening or pericholecystic fluid. No sonographic Murphy sign. Common bile duct: Diameter: 1.0 cm Liver: No focal lesion identified. Diffusely increased echogenicity. Smooth  contour. Portal vein is patent on color Doppler imaging with normal direction of blood flow towards the liver. Other: No perihepatic ascites. IMPRESSION: 1. Mild hepatic steatosis. 2. Gallbladder sludge, no sonographic evidence of acute cholecystitis. Ruthann Cancer, MD [facility] Radiology Electronically Signed   By: Ruthann Cancer M.D.   On: 08/03/2022 16:31   CT ABDOMEN PELVIS W CONTRAST  Result Date: 08/03/2022 CLINICAL DATA:  Abdominal pain EXAM: CT ABDOMEN AND PELVIS WITH CONTRAST TECHNIQUE: Multidetector CT imaging of the abdomen and pelvis was performed using the standard protocol following bolus administration of intravenous contrast. RADIATION DOSE REDUCTION: This exam was performed according to the departmental dose-optimization program which includes automated exposure control, adjustment of the mA and/or kV according to patient size and/or use of iterative reconstruction technique. CONTRAST:  40m OMNIPAQUE IOHEXOL 300 MG/ML  SOLN COMPARISON:  07/31/2022 FINDINGS: Lower chest: Mild atelectasis is noted in the right lung base. Hepatobiliary: Gallbladder is well distended. Liver is well visualized without focal mass. Intrahepatic biliary ductal dilatation is seen new from the prior exam. The common bile duct is prominent measuring up to 12 mm. No definitive choledocholithiasis is seen. Pancreas:  Unremarkable. No pancreatic ductal dilatation or surrounding inflammatory changes. Spleen: Normal in size without focal abnormality. Adrenals/Urinary Tract: Adrenal glands are within normal limits. Kidneys demonstrate a normal enhancement pattern. No renal calculi or obstructive changes are noted. Normal excretion is noted on delayed images. Ureters are within normal limits. The bladder is unremarkable. Stomach/Bowel: The appendix has been surgically removed. Changes of mild diverticulitis are seen in the sigmoid colon. No perforation or abscess formation is noted. These changes are new from the prior exam. Small bowel and stomach are unremarkable with the exception of a small sliding-type hiatal hernia. Vascular/Lymphatic: Aortic atherosclerosis. No enlarged abdominal or pelvic lymph nodes. Reproductive: Uterus and bilateral adnexa are unremarkable. Other: No abdominal wall hernia or abnormality. No abdominopelvic ascites. Musculoskeletal: No acute or significant osseous findings. IMPRESSION: Changes of very mild diverticulitis in the sigmoid colon without perforation or abscess formation. This is new from the prior exam. No evidence of pancreatitis. Dilatation of the common bile duct 12 mm although no choledocholithiasis is seen. Very mild intrahepatic ductal dilatation is seen as well. Electronically Signed   By: MInez CatalinaM.D.   On: 08/03/2022 03:34   CT ABDOMEN PELVIS W CONTRAST  Result Date: 07/31/2022 CLINICAL DATA:  Dizziness, diarrhea, weight loss EXAM: CT ABDOMEN AND PELVIS WITH CONTRAST TECHNIQUE: Multidetector CT imaging of the abdomen and pelvis was performed using the standard protocol following bolus administration of intravenous contrast. RADIATION DOSE REDUCTION: This exam was performed according to the departmental dose-optimization program which includes automated exposure control, adjustment of the mA and/or kV according to patient size and/or use of iterative reconstruction technique.  CONTRAST:  1069mOMNIPAQUE IOHEXOL 300 MG/ML  SOLN COMPARISON:  None Available. FINDINGS: Lower chest: Small linear densities in posterior right lower lung field may suggest minimal scarring. Hepatobiliary: There is fatty infiltration in liver. There is no significant dilation of intrahepatic bile ducts. Distal common bile duct in the head of the pancreas measures 10 mm. Gallbladder is distended. This may be due to fasting state. There is no wall thickening. There is no fluid around the gallbladder. Pancreas: There is minimal prominence of pancreatic duct. No significant inflammatory stranding is noted adjacent to pancreas. There are no loculated fluid collections in or around the pancreas. There is a tiny punctate calcification in the body. Spleen: Unremarkable.  Adrenals/Urinary Tract: Adrenals are unremarkable. There is no hydronephrosis. There are no renal or ureteral stones. Urinary bladder is unremarkable. Stomach/Bowel: Small hiatal hernia is seen. Stomach is unremarkable. Small bowel loops are not dilated. Appendix is not seen. There is fluid in the lumen of: And rectum. There is mild mucosal enhancement in colon. There is no significant focal wall thickening. There is no pericolic stranding. Vascular/Lymphatic: Scattered arterial calcifications are seen. Reproductive: Unremarkable. Other: There is no ascites or pneumoperitoneum. Musculoskeletal: Unremarkable. IMPRESSION: There is no evidence of intestinal obstruction or pneumoperitoneum. There is no hydronephrosis. There is mild mucosal enhancement in colon. There is fluid in the lumen of colon and rectum. Findings suggest possible nonspecific enterocolitis. Small hiatal hernia.  Fatty liver. Other findings as described in the body of the report. Electronically Signed   By: Elmer Picker M.D.   On: 07/31/2022 16:33     Subjective: No acute issues or events overnight, tolerated procedure well   Discharge Exam: Vitals:   08/16/22 0815 08/16/22  0827  BP: 116/81 99/87  Pulse: 75 73  Resp: 20 20  Temp: 98.3 F (36.8 C) 98.3 F (36.8 C)  SpO2: 98% 98%   Vitals:   08/16/22 0621 08/16/22 0701 08/16/22 0815 08/16/22 0827  BP: 114/72 132/76 116/81 99/87  Pulse: 69 70 75 73  Resp: '16  20 20  '$ Temp: (!) 97.5 F (36.4 C) 97.6 F (36.4 C) 98.3 F (36.8 C) 98.3 F (36.8 C)  TempSrc: Oral Oral    SpO2: 100% 99% 98% 98%  Weight:      Height:        General: Pt is alert, awake, not in acute distress Cardiovascular: RRR, S1/S2 +, no rubs, no gallops Respiratory: CTA bilaterally, no wheezing, no rhonchi Abdominal: Soft, NT, ND, bowel sounds + Extremities: no edema, no cyanosis    The results of significant diagnostics from this hospitalization (including imaging, microbiology, ancillary and laboratory) are listed below for reference.     Microbiology: Recent Results (from the past 240 hour(s))  Blood culture (routine x 2)     Status: None   Collection Time: 08/08/22  4:45 PM   Specimen: BLOOD  Result Value Ref Range Status   Specimen Description   Final    BLOOD BLOOD RIGHT ARM Performed at [facility], [address] [place], [place] [address]    Special Requests   Final    BOTTLES DRAWN AEROBIC AND ANAEROBIC Blood Culture adequate volume   Culture   Final    NO GROWTH 5 DAYS Performed at [facility] [address], [place], [place] [address]    Report Status 08/13/2022 FINAL  Final  Blood culture (routine x 2)     Status: None   Collection Time: 08/08/22  4:50 PM   Specimen: BLOOD  Result Value Ref Range Status   Specimen Description   Final    BLOOD LEFT ANTECUBITAL Performed at [facility], [address] [place], [place] [address]    Special Requests   Final    BOTTLES DRAWN AEROBIC AND ANAEROBIC Blood Culture results may not be optimal due to an excessive volume of blood received in culture bottles   Culture   Final    NO GROWTH 5 DAYS Performed at  [facility], [place] [address], [place],  [address]    Report Status 08/13/2022 FINAL  Final     Labs: BNP (last 3 results) No results for input(s): "BNP" in  the last 8760 hours. Basic Metabolic Panel: Recent Labs  Lab 08/10/22 0539 08/13/22 1716 08/14/22 0955 08/15/22 0145 08/16/22 0107  NA 136 133* 137 140 141  K 3.9 3.7 3.4* 3.5 3.8  CL 108 100 105 112* 116*  CO2 21* 17* 22 21* 21*  GLUCOSE 89 137* 106* 95 86  BUN '6 15 15 9 '$ <5*  CREATININE 0.71 2.22* 1.00 0.69 0.63  CALCIUM 9.5 11.0* 9.2 8.3* 8.4*  MG  --   --   --  1.7  --    Liver Function Tests: Recent Labs  Lab 08/10/22 0539 08/13/22 1716 08/14/22 0955 08/15/22 0145 08/16/22 0107  AST 42* 33 '31 30 30  '$ ALT 40 32 '26 24 26  '$ ALKPHOS 277* 301* 202* 175* 165*  BILITOT 0.9 1.4* 1.1 0.7 0.6  PROT 6.3* 8.5* 6.2* 4.7* 4.8*  ALBUMIN 3.6 5.1* 3.8 2.7* 2.8*   Recent Labs  Lab 08/13/22 1716 08/15/22 0145  LIPASE 520* 264*   No results for input(s): "AMMONIA" in the last 168 hours. CBC: Recent Labs  Lab 08/10/22 0539 08/13/22 1716 08/15/22 0145 08/16/22 0107  WBC 7.8 13.0* 8.2 6.1  NEUTROABS  --   --  5.2  --   HGB 14.8 19.1* 12.6 12.1  HCT 40.5 51.4* 35.1* 35.0*  MCV 94.2 91.0 95.6 98.0  PLT 360 447* 281 244   Cardiac Enzymes: No results for input(s): "CKTOTAL", "CKMB", "CKMBINDEX", "TROPONINI" in the last 168 hours.  BNP: Invalid input(s): "POCBNP" CBG: No results for input(s): "GLUCAP" in the last 168 hours. D-Dimer No results for input(s): "DDIMER" in the last 72 hours. Hgb A1c No results for input(s): "HGBA1C" in the last 72 hours. Lipid Profile No results for input(s): "CHOL", "HDL", "LDLCALC", "TRIG", "CHOLHDL", "LDLDIRECT" in the last 72 hours. Thyroid function studies No results for input(s): "TSH", "T4TOTAL", "T3FREE", "THYROIDAB" in the last 72 hours.  Invalid input(s): "FREET3" Anemia work up Recent Labs    08/15/22 0645  FERRITIN 96   Urinalysis    Component Value  Date/Time   COLORURINE ORANGE (A) 08/13/2022 1722   APPEARANCEUR CLEAR 08/13/2022 1722   LABSPEC >1.030 (H) 08/13/2022 1722   PHURINE 6.5 08/13/2022 1722   GLUCOSEU 100 (A) 08/13/2022 1722   HGBUR NEGATIVE 08/13/2022 1722   BILIRUBINUR LARGE (A) 08/13/2022 1722   KETONESUR NEGATIVE 08/13/2022 1722   PROTEINUR 100 (A) 08/13/2022 1722   NITRITE POSITIVE (A) 08/13/2022 1722   LEUKOCYTESUR NEGATIVE 08/13/2022 1722   Sepsis Labs Recent Labs  Lab 08/10/22 0539 08/13/22 1716 08/15/22 0145 08/16/22 0107  WBC 7.8 13.0* 8.2 6.1   Microbiology Recent Results (from the past 240 hour(s))  Blood culture (routine x 2)     Status: None   Collection Time: 08/08/22  4:45 PM   Specimen: BLOOD  Result Value Ref Range Status   Specimen Description   Final    BLOOD BLOOD RIGHT ARM Performed at [facility], [address], [place], [place] [address]    Special Requests   Final    BOTTLES DRAWN AEROBIC AND ANAEROBIC Blood Culture adequate volume   Culture   Final    NO GROWTH 5 DAYS Performed at [facility], [place] [address], [place], [place] [address]    Report Status 08/13/2022 FINAL  Final  Blood culture (routine x 2)     Status: None   Collection Time: 08/08/22  4:50 PM   Specimen: BLOOD  Result Value Ref Range Status   Specimen Description  Final    BLOOD LEFT ANTECUBITAL Performed at [facility], [address], [place], [place] [address]    Special Requests   Final    BOTTLES DRAWN AEROBIC AND ANAEROBIC Blood Culture results may not be optimal due to an excessive volume of blood received in culture bottles   Culture   Final    NO GROWTH 5 DAYS Performed at [facility] [address] [place],  [address]    Report Status 08/13/2022 FINAL  Final     Time coordinating discharge: Over 30 minutes  SIGNED:   Little Ishikawa, DO [facility] 08/16/2022, 5:10 PM Pager   If 7PM-7AM, please contact  night-coverage [email or web address]

## 2022-08-15 NOTE — H&P (Signed)
History and Physical    PLEASE NOTE THAT DRAGON DICTATION SOFTWARE WAS USED IN THE CONSTRUCTION OF THIS NOTE.   [name] WRU:[id number] DOB: Jul 27, 1961 DOA: 08/13/2022  PCP: Mckinley Jewel, MD  Patient coming from: home   I have personally briefly reviewed patient's old medical records in [place]  Chief Complaint: Abdominal pain  HPI: Rachel Weber is a 61 y.o. female with medical history significant for generalized anxiety disorder, who is admitted to [facility] on 08/13/2022 by way of transfer from [facility] emergency department for further evaluation and management elevated liver enzymes presenting from home to the latter facility complaining of intractable nausea/vomiting.   The patient was recently hospitalized at [facility] from 08/09/22 to 08/10/22 with abdominal pain, nausea/vomiting with work-up at that time notable for elevated transaminases as well as elevated lipase [facility] was consulted, and ensuing MRCP was notable for intra and extrahepatic biliary duct dilation.  The patient was subsequently discharged home on 8/23, with plan to follow-up as an outpatient with [facility] gastroenterology in order to continue the work-up for her elevated liver enzymes in the setting of intra and extrahepatic biliary duct dilation.  However, before the date of her outpatient follow-up with [facility] gastroenterology, she has experienced further progression in her abdominal discomfort associated with recurrent nausea/vomiting.  She now notes very limited ability to tolerate any p.o. over the last few days, which is ultimately prompted her to present to [facility] emergency department for further evaluation and management of the above.  Over the last 2 days, she has noted frequent nausea resulting in at least 6-7 episodes of nonbloody, nonbilious emesis over that timeframe, with most recent such episode occurring at [facility] emergency department earlier today.   This is also been associated with worsening of her sharp right upper quadrant abdominal discomfort with radiation into the epigastrium.  While she still considers his pain to be intermittent, she has noticed an increase in the associated frequency of this pain, worse with palpation over the right upper quadrant/epigastrium as well as in an postprandial timeframe.  She has noted few episodes of loose stool over the last few days, in the absence of any melena or hematochezia.  Denies any associated subjective fever, chills, rigors, or generalized myalgias.  No rash.  Denies any chest pain, shortness of breath, palpitations, diaphoresis.  She confirms that she is a long-term cigarette smoker.  Denies any history of inflammatory bowel disease.  No recent dysuria or gross hematuria.     [facility] ED Course:  Vital signs in the ED were notable for the following: Afebrile; heart rate initially 94-1 02, subsequent improving into the range of 74-80 following IV fluid administration, as quantified below; systolic blood pressures in the high 90s to low 100s; respiratory rate 16-20, oxygen saturation 96 to 98% on room air.  Labs were notable for the following: CMP notable for sodium 133, potassium 3.7 bicarbonate 17, anion gap of 16, creatinine 2.22 compared to most recent prior value of 0.71 on 08/10/2022, glucose 137, calcium 11.0, alkaline phosphatase 301 compared to 277 on 08/10/2022, AST 33, ALT 32, total bilirubin 1.4, relative to 0.9 on 08/10/2022.  Lipase 520, compared to 553 on 08/10/2022.  CBC notable for white blood cell count 13,000 compared to 7.8 on 08/10/2022, hemoglobin 19 compared to 14.8, platelet count 447 compared to 360.  Urinalysis notable for no white blood cells, no red blood cells, but was found to be positive for hyaline casts, and demonstrated 100 protein  as well as specific gravity greater than 1.030.  Imaging and additional notable ED work-up: (No additional imaging was performed at  [facility] this evening)  While in the ED, the following were administered: Morphine 4 mg IV x1, Zofran 4 mg IV x1 hypertonus 40 mg IV x1, normal saline x1 L bolus followed by initiation continuous NS at 125 cc/h.  Rocephin x1 dose.  Dilaudid 0.5 mg IV x4 doses.  EDP contacted on-call [facility] gastroenterology, Dr.Outlaw, with request for morning consult.   Subsequently, the patient was admitted to [facility] for further evaluation and management of elevated liver enzymes in the setting of intrahepatic biliary duct dilation along with associated symptomatic control in the context of worsening nausea/vomiting and abdominal discomfort.    Review of Systems: As per HPI otherwise 10 point review of systems negative.   Past Medical History:  Diagnosis Date   Allergy    Anxiety    Cataract    TMJ arthritis     Past Surgical History:  Procedure Laterality Date   APPENDECTOMY     BREAST SURGERY     COSMETIC SURGERY      Social History:  reports that she has been smoking cigarettes. She has a 30.00 pack-year smoking history. She has never used smokeless tobacco. She reports that she does not currently use alcohol. She reports that she does not currently use drugs.   Allergies  Allergen Reactions   Flagyl [Metronidazole] Nausea And Vomiting   Hydrocodone Other (See Comments)    Hallucinations   Oxycontin [Oxycodone Hcl] Nausea And Vomiting and Other (See Comments)    Hallucinations    Family History  Problem Relation Age of Onset   Cancer Mother     Family history reviewed and not pertinent    Prior to Admission medications   Medication Sig Start Date End Date Taking? Authorizing Provider  acetaminophen-codeine (TYLENOL #3) 300-30 MG tablet Take 1-2 tablets by mouth See admin instructions. Take 1-2 tablets by mouth 2 to 3 times daily as needed for pain 01/16/20  Yes [provider]  albuterol (PROVENTIL HFA;VENTOLIN HFA) 108 (90 Base) MCG/ACT inhaler INHALE 1-2 PUFFS  INTO THE LUNGS EVERY 4 (FOUR) HOURS AS NEEDED FOR WHEEZING OR SHORTNESS OF BREATH. 07/16/18  Yes [name], MD  BANOPHEN 25 MG capsule Take 25 mg by mouth 2 (two) times daily as needed for allergies.   Yes [provider]  LORazepam (ATIVAN) 1 MG tablet Take 1 tablet (1 mg total) by mouth 2 (two) times daily as needed for anxiety. Patient taking differently: Take 1 mg by mouth daily as needed for anxiety or sleep. 06/16/18  Yes [name], MD  temazepam (RESTORIL) 15 MG capsule Take 15-30 mg by mouth at bedtime as needed for sleep. 07/16/22  Yes [provider]  saccharomyces boulardii (FLORASTOR) 250 MG capsule Take 1 capsule (250 mg total) by mouth 2 (two) times daily for 7 days. Patient not taking: Reported on 08/14/2022 08/10/22 08/17/22  Lavina Hamman, MD     Objective    Physical Exam: Vitals:   08/14/22 1413 08/14/22 1516 08/14/22 1818 08/14/22 2102  BP: 125/86 104/65 98/67 99/64  Pulse: 85 80 74 69  Resp: _0 Temp: 98.6 F (37 C)  97.9 F (36.6 C) 97.7 F (36.5 C)  TempSrc: Oral  Oral Oral  SpO2: 99% 96% 98% 98%  Weight:   72.6 kg   Height:   _1  (1.753 m)  General: appears to be stated age; alert, oriented Skin: warm, dry, no rash Head:  AT/[place] Mouth:  Oral mucosa membranes appear dry, normal dentition Neck: supple; trachea midline Heart:  RRR; did not appreciate any M/R/G Lungs: CTAB, did not appreciate any wheezes, rales, or rhonchi Abdomen: + BS; soft, ND, tender to palpation over the right upper quadrant, in the absence of associated guarding, rigidity, or rebound tenderness. Vascular: 2+ pedal pulses b/l; 2+ radial pulses b/l Extremities: no peripheral edema, no muscle wasting Neuro: strength and sensation intact in upper and lower extremities b/l  Labs on Admission: I have personally reviewed following labs and imaging studies  CBC: Recent Labs  Lab 08/08/22 1413 08/09/22 0500 08/10/22 0539 08/13/22 1716  08/15/22 0145  WBC 11.7* 8.0 7.8 13.0* 8.2  NEUTROABS  --  5.4  --   --  5.2  HGB 17.9* 14.7 14.8 19.1* 12.6  HCT 48.5* 40.1 40.5 51.4* 35.1*  MCV 91.5 93.3 94.2 91.0 95.6  PLT 390 300 360 447* 818   Basic Metabolic Panel: Recent Labs  Lab 08/08/22 1843 08/08/22 2200 08/09/22 0500 08/10/22 0539 08/13/22 1716 08/14/22 0955 08/15/22 0145  NA  --    < > 135 136 133* 137 140  K  --    < > 3.2* 3.9 3.7 3.4* 3.5  CL  --    < > 104 108 100 105 112*  CO2  --    < > 21* 21* 17* 22 21*  GLUCOSE  --    < > 98 89 137* 106* 95  BUN  --    < > _0 CREATININE  --    < > 0.75 0.71 2.22* 1.00 0.69  CALCIUM  --    < > 9.1 9.5 11.0* 9.2 8.3*  MG 1.8  --   --   --   --   --  1.7   < > = values in this interval not displayed.   GFR: Estimated Creatinine Clearance: 79.1 mL/min (by C-G formula based on SCr of 0.69 mg/dL). Liver Function Tests: Recent Labs  Lab 08/09/22 0500 08/10/22 0539 08/13/22 1716 08/14/22 0955 08/15/22 0145  AST 38 42* 33 31 30  ALT 38 40 32 26 24  ALKPHOS 306* 277* 301* 202* 175*  BILITOT 1.2 0.9 1.4* 1.1 0.7  PROT 6.4* 6.3* 8.5* 6.2* 4.7*  ALBUMIN 3.8 3.6 5.1* 3.8 2.7*   Recent Labs  Lab 08/08/22 1413 08/13/22 1716 08/15/22 0145  LIPASE 553* 520* 264*   No results for input(s): "AMMONIA" in the last 168 hours. Coagulation Profile: No results for input(s): "INR", "PROTIME" in the last 168 hours. Cardiac Enzymes: Recent Labs  Lab 08/08/22 1843  CKTOTAL 46   BNP (last 3 results) No results for input(s): "PROBNP" in the last 8760 hours. HbA1C: No results for input(s): "HGBA1C" in the last 72 hours. CBG: No results for input(s): "GLUCAP" in the last 168 hours. Lipid Profile: No results for input(s): "CHOL", "HDL", "LDLCALC", "TRIG", "CHOLHDL", "LDLDIRECT" in the last 72 hours. Thyroid Function Tests: No results for input(s): "TSH", "T4TOTAL", "FREET4", "T3FREE", "THYROIDAB" in the last 72 hours. Anemia Panel: No results for input(s):  "VITAMINB12", "FOLATE", "FERRITIN", "TIBC", "IRON", "RETICCTPCT" in the last 72 hours. Urine analysis:    Component Value Date/Time   COLORURINE ORANGE (A) 08/13/2022 1722   APPEARANCEUR CLEAR 08/13/2022 1722   LABSPEC >1.030 (H) 08/13/2022 1722   PHURINE 6.5 08/13/2022 1722   GLUCOSEU 100 (A) 08/13/2022 1722  HGBUR NEGATIVE 08/13/2022 1722   BILIRUBINUR LARGE (A) 08/13/2022 1722   KETONESUR NEGATIVE 08/13/2022 1722   PROTEINUR 100 (A) 08/13/2022 1722   NITRITE POSITIVE (A) 08/13/2022 1722   LEUKOCYTESUR NEGATIVE 08/13/2022 1722    Radiological Exams on Admission: No results found.    Assessment/Plan   Principal Problem:   Elevated LFTs Active Problems:   GAD (generalized anxiety disorder)   Abdominal pain   Nausea & vomiting   Hypercalcemia   AKI (acute kidney injury) ([place])   Dehydration   High anion gap metabolic acidosis         #) Elevated liver enzymes: Progressive elevation of alkaline phosphatase and total bilirubin without overt laboratory evidence of hepatocellular involvement, in the context of recent finding of intra and extrahepatic biliary ductal dilation on MRCP performed by [facility] on 08/09/2022. She was scheduled to follow-up in [facility] in order to further expand associated work-up as an outpatient.  However, has developed worsening of her abdominal pain as well as nausea/vomiting in the interval prompting readmission in order to expedite the above GI work-up.   In terms of her alkaline phosphatase/total bilirubin predominant elevation, will check AMA to further assess for primary biliary cholangitis.  Of note, she does not have any known history of inflammatory bowel disease.  Presenting labs also notable for polycythemia with hemoglobin of 19.  I will add on a ferritin level to further assess, although I suspect that this elevated hemoglobin is more on the basis of hemoconcentration in the setting of concomitant dehydration.  As noted above, on-call  [facility]ologist, Dr. Paulita Fujita has been contacted with request for AM consultation for the above.  While presentation is associated with mild leukocytosis and mild tachycardia, I suspect that these findings are more on the basis of associated dehydration.  No overt evidence of underlying infectious process at this time.  Therefore, criteria for sepsis not currently met.   Plan: Check antimitochondrial antibodies, as above.  Add on ferritin level.  Repeat CMP in the morning.  Check check GGT and direct bilirubin.  Check LDH.  [facility] consulted, as above.  Repeat CBC in the morning.  Repeat lipase.  Prn IV Dilaudid.  As needed IV Zofran.  Continuous normal saline at 125 cc/h.  Monitor strict I's and O's and weights.  Check INR.           #) Acute Kidney Injury: Presenting serum creatinine 2.22 compared to most recent prior value of 0.71 on 08/10/2022.  Appears prerenal as consequence of dehydration stemming from interval recurrent nausea/vomiting and associated inability to tolerate p.o..  Presenting urinalysis with microscopy demonstrates the presence of hyaline cast, consistent with a picture of dehydration, showing no evidence of white blood cell red blood cell casts.  Urinalysis was also notable for 100 protein.  Of note, most recent prior hospitalization was also associated with AKI due to similar mechanism, which improved following IV fluids.    Plan: monitor strict I's & O's and daily weights. Attempt to avoid nephrotoxic agents. Refrain from NSAIDs. Repeat CMP in the morning. Check serum magnesium level.  Continuous IV fluids, as above.             #) Dehydration: Clinical suspicion for such, including the appearance of dry oral mucous membranes as well as laboratory findings notable for UA demonstrating elevated specific gravity, and presence of hyaline casts.  This is in addition to interval development of leukocytosis, polycythemia and thrombocytosis, suggestive of an  element of  hemoconcentration stemming from underlying dehydration due to interval increase in GI losses with concomitant decline in oral intake.     Plan: Monitor strict I's and O's.  Daily weights.  Repeat CMP in the morning. IVF's as above.          #) Hypercalcemia: Presenting labs reflect serum calcium of 11.0.  Suspect element of dehydration, without overt pharmacologic contribution. Will initiate gentle IVF's, as above, with repeat calcium level in the morning, with consideration for further expansion of work-up if no ensuing improvement in serum calcium level following interval IVF administration.     Plan: NS, as above.  Monitor strict I's&O's, daily weights.  CMP in the morning.   Check serum Mg level.           #) Anion gap metabolic acidosis: Identified on CMP performed at [facility] emergency department.  Suspect that this is multifactorial in etiology, with contributions from acute kidney injury, dehydration, as well as starvation keto lactic acidosis, given the patient's inability to tolerate p.o. over the last 1 to 2 days.  No significant elevation in serum glucose to suggest DKA.  As stated above, criteria for sepsis not met.  Plan: Further evaluation and management of presenting acute kidney injury, as above, including continuous IV fluids.  Monitor strict I's and O's Daily weights.  Repeat CMP/CBC in the morning.  Check INR.         #) Generalized anxiety disorder: Documented history of such, on as needed Ativan as an outpatient.  Of note, the patient is not on any scheduled SSRI or SNRI as an outpatient.  Plan: In the setting of recurrent nausea/vomiting and very limited ability to tolerate p.o. at this time, will transiently convert her outpatient oral Ativan to prn IV dosing.        DVT prophylaxis: SCD's   Code Status: Full code Family Communication: none Disposition Plan: Per Rounding Team Consults called: EDP consulted on-call [facility] (Dr.  Paulita Fujita), as further detailed above;  Admission status: obs    PLEASE NOTE THAT DRAGON DICTATION SOFTWARE WAS USED IN THE CONSTRUCTION OF THIS NOTE.   [place] DO [facility]  From [place]   08/15/2022, 5:28 AM

## 2022-08-15 NOTE — H&P (View-Only) (Signed)
[facility] Gastroenterology Consult  Referring Provider: No ref. provider found Primary Care Physician:  Mckinley Jewel, MD Primary Gastroenterologist: [facility]enter[facility]  Reason for Consultation:  Nausea, vomiting, diarrhea, abdominal pain, weight loss, elevated ALP  HPI: Rachel Weber is a 61 y.o. female has been to the ER 3 times with symptoms of nausea, vomiting and abdominal pain. Patient states that she was in her usual state of health until 6 months ago, when she developed diarrhea described as multiple episodes of loose and watery stools.  She denies noticing blood in stool or black stools, however complains of bloating and lower abdominal pain. 2 months ago she finally made an appointment with a primary care physician and was recommended to get a Cologuard study as patient has never had a colonoscopy previously. However, patient had worsening of diarrhea and went to [facility] on 07/31/2022, where she was discharged on oral Cipro and Flagyl. She came back to the ER with persistent abdominal pain and diarrhea, not responding to Cipro and Flagyl, was started on IV Zosyn, was noticed to have elevated liver enzymes, MRCP was performed on 08/09/2022 which showed intra and extrahepatic biliary ductal dilatation, CBD 12 mm, distal tapering of duct without choledocholithiasis or ampullary head mass.  Findings possibly reflecting recently passed gallstone.  Although sclerosing or autoimmune cholangitis, could be a possibility. LFTs were T. bili/AST/ALT/ALP of 1.4/52/51/429 and lipase 553 on 08/08/2022. Patient left AMA as the mother was sick. Was readmitted from 08/08/2022 to 08/10/2022 for mild diverticulitis with plans for outpatient colonoscopy, no plans for ERCP as stone/sludge not noted on MRCP. She was readmitted on 08/13/2022 with persistent nausea, vomiting, diarrhea, lower abdominal pain, bloating and weight loss of 15 pounds in the last 2 weeks.  Patient states she used to drink wine  on weekends, denies significant alcohol use. 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 She denies recreational drug use. She has been using over-the-counter probiotic for diarrhea along with Imodium, as use of herbal remedies or new medications. Denies fever, sick contact or recent travel.  There is no family history of celiac disease, inflammatory bowel disease or colon cancer. No prior EGD or colonoscopy. Denies difficulty swallowing, pain on swallowing but complains of early satiety and increased acid reflux.  Past Medical History:  Diagnosis Date   Allergy    Anxiety    Cataract    TMJ arthritis     Past Surgical History:  Procedure Laterality Date   APPENDECTOMY     BREAST SURGERY     COSMETIC SURGERY      Prior to Admission medications   Medication Sig Start Date End Date Taking? Authorizing Provider  acetaminophen-codeine (TYLENOL #3) 300-30 MG tablet Take 1-2 tablets by mouth See admin instructions. Take 1-2 tablets by mouth 2 to 3 times daily as needed for pain 01/16/20  Yes [provider]  albuterol (PROVENTIL HFA;VENTOLIN HFA) 108 (90 Base) MCG/ACT inhaler INHALE 1-2 PUFFS INTO THE LUNGS EVERY 4 (FOUR) HOURS AS NEEDED FOR WHEEZING OR SHORTNESS OF BREATH. 07/16/18  Yes [name], MD  BANOPHEN 25 MG capsule Take 25 mg by mouth 2 (two) times daily as needed for allergies.   Yes [provider]  LORazepam (ATIVAN) 1 MG tablet Take 1 tablet (1 mg total) by mouth 2 (two) times daily as needed for anxiety. Patient taking differently: Take 1 mg by mouth daily as needed for anxiety or sleep. 06/16/18  Yes Carlota Raspberry,  Ranell Patrick, MD  temazepam (RESTORIL) 15 MG capsule Take 15-30 mg by mouth at bedtime as needed for sleep. 07/16/22  Yes [provider]  saccharomyces boulardii (FLORASTOR) 250 MG capsule Take 1 capsule (250 mg total) by mouth 2 (two) times daily for 7 days. Patient not taking: Reported on 08/14/2022  08/10/22 08/17/22  Lavina Hamman, MD    Current Facility-Administered Medications  Medication Dose Route Frequency Provider Last Rate Last Admin   0.9 %  sodium chloride infusion   Intravenous Continuous Blue, Soijett A, PA-C 125 mL/hr at 08/15/22 0148 New Bag at 08/15/22 0148   acetaminophen (TYLENOL) tablet 650 mg  650 mg Oral Q6H PRN Howerter, Justin B, DO       Or   acetaminophen (TYLENOL) suppository 650 mg  650 mg Rectal Q6H PRN Howerter, Justin B, DO       ondansetron (ZOFRAN) injection 4 mg  4 mg Intravenous Q6H PRN Howerter, Justin B, DO       polyethylene glycol-electrolytes (NuLYTELY) solution 4,000 mL  4,000 mL Oral Once [name], MD       potassium chloride 10 mEq in 100 mL IVPB  10 mEq Intravenous Q1 Hr x 4 Howerter, Justin B, DO 100 mL/hr at 08/15/22 0826 10 mEq at 08/15/22 0826    Allergies as of 08/13/2022 - Review Complete 08/13/2022  Allergen Reaction Noted   Flagyl [metronidazole] Nausea And Vomiting 08/10/2022   Hydrocodone Other (See Comments) 08/01/2022   Oxycontin [oxycodone hcl] Nausea And Vomiting and Other (See Comments) 08/01/2022    Family History  Problem Relation Age of Onset   Cancer Mother     Social History   Socioeconomic History   Marital status: Married    Spouse name: Not on file   Number of children: 1   Years of education: Not on file   Highest education level: Not on file  Occupational History   Occupation: Secretary/administrator    Comment: from [place]  Tobacco Use   Smoking status: Every Day    Packs/day: 1.00    Years: 30.00    Total pack years: 30.00    Types: Cigarettes   Smokeless tobacco: Never   Tobacco comments:    "Serial quitter" - currently smoking a couple a day  Vaping Use   Vaping Use: Some days  Substance and Sexual Activity   Alcohol use: Not Currently    Comment: social recently, heavy use in her 80s, none recently   Drug use: Not Currently   Sexual activity: Yes  Other Topics Concern   Not on file   Social History Narrative   Not on file   Social Determinants of Health   Financial Resource Strain: Not on file  Food Insecurity: Not on file  Transportation Needs: Not on file  Physical Activity: Not on file  Stress: Not on file  Social Connections: Not on file  Intimate Partner Violence: Not on file    Review of Systems: Positive for: GI: Described in detail in HPI.    Gen: Involuntary weight loss, denies any fever, chills, rigors, night sweats, anorexia, fatigue, weakness, malaise,  and sleep disorder CV: Denies chest pain, angina, palpitations, syncope, orthopnea, PND, peripheral edema, and claudication. Resp: Denies dyspnea, cough, sputum, wheezing, coughing up blood. GU : Denies urinary burning, blood in urine, urinary frequency, urinary hesitancy, nocturnal urination, and urinary incontinence. MS: Denies joint pain or swelling.  Denies muscle weakness, cramps, atrophy.  Derm: Denies rash, itching, oral ulcerations, hives, unhealing  ulcers.  Psych: Anxiety,denies depression,  memory loss, suicidal ideation, hallucinations  and confusion. Heme: Denies bruising, bleeding, and enlarged lymph nodes. Neuro:  Denies any headaches, dizziness, paresthesias. Endo:  Denies any problems with DM, thyroid, adrenal function.  Physical Exam: Vital signs in last 24 hours: Temp:  [97.6 F (36.4 C)-98.6 F (37 C)] 98.2 F (36.8 C) (08/28 0824) Pulse Rate:  [69-85] 70 (08/28 0824) Resp:  [16-19] 17 (08/28 0824) BP: (93-125)/(60-86) 102/79 (08/28 0824) SpO2:  [96 %-100 %] 98 % (08/28 0824) Weight:  [72.6 kg-73.1 kg] 73.1 kg (08/28 0500) Last BM Date : 08/14/22  General:   Alert,  Well-developed, well-nourished, pleasant and cooperative in NAD Head:  Normocephalic and atraumatic. Eyes:  Sclera clear, no icterus.   Conjunctiva pink. Ears:  Normal auditory acuity. Nose:  No deformity, discharge,  or lesions. Mouth:  No deformity or lesions.  Oropharynx pink & moist. Neck:  Supple; no  masses or thyromegaly. Lungs:  Clear throughout to auscultation.   No wheezes, crackles, or rhonchi. No acute distress. Heart:  Regular rate and rhythm; no murmurs, clicks, rubs,  or gallops. Extremities:  Without clubbing or edema. Neurologic:  Alert and  oriented x4;  grossly normal neurologically. Skin:  Intact without significant lesions or rashes. Psych:  Alert and cooperative. Normal mood and affect. Abdomen:  Soft, mild lower abdominal tenderness and nondistended. No masses, hepatosplenomegaly or hernias noted. Normal bowel sounds, without guarding, and without rebound.         Lab Results: Recent Labs    08/13/22 1716 08/15/22 0145  WBC 13.0* 8.2  HGB 19.1* 12.6  HCT 51.4* 35.1*  PLT 447* 281   BMET Recent Labs    08/13/22 1716 08/14/22 0955 08/15/22 0145  NA 133* 137 140  K 3.7 3.4* 3.5  CL 100 105 112*  CO2 17* 22 21*  GLUCOSE 137* 106* 95  BUN '15 15 9  '$ CREATININE 2.22* 1.00 0.69  CALCIUM 11.0* 9.2 8.3*   LFT Recent Labs    08/15/22 0145  PROT 4.7*  ALBUMIN 2.7*  AST 30  ALT 24  ALKPHOS 175*  BILITOT 0.7   PT/INR Recent Labs    08/15/22 0645  LABPROT 14.3  INR 1.1    Studies/Results: No results found.  Impression: Nausea, vomiting, diarrhea, lower abdominal pain, weight loss  Elevated LFTs-cholestatic pattern, elevated alkaline phosphatase with normal AST, ALT and T. Bili  Elevated lipase and abdominal pain without imaging evidence of pancreatitis  Acute renal failure, BUN 15, creatinine 2.22, GFR 25 on admission-resolved, BUN 9, creatinine 0.69 and GFR more than 60 today  Mild acidosis  Hepatitis C antibody reactive on 08/03/2022  C. difficile and GI pathogen panel negative on 07/31/2022 and 08/03/2022  CT abdomen and pelvis with contrast 08/08/2022: Decreased wall thickening and inflammatory change of sigmoid compatible with resolving acute diverticulitis Mild intrahepatic biliary ductal dilatation and moderate dilation of common bile  duct, unchanged  MRI/MRCP 08/09/2022: Intra and extrahepatic ductal dilatation, CBD 12 mm, tapering at level of ampulla without choledocholithiasis ampullary head mass?  Sequelae of recently passed gallstone versus sclerosing autoimmune cholangitis Distended gallbladder without thickening or fluid collection or stones  Plan: EGD with small bowel biopsy to evaluate for celiac. Colonoscopy with random colon biopsies to evaluate for microscopic colitis. The risks and the benefits of the procedure were discussed with the patient in details. We will start patient on clear liquid diet, colonic prep and plan for EGD and colonoscopy in a.m. tomorrow.  Will order ASMA/F-actin IgG and AMA levels to evaluate for autoimmune hepatitis and primary biliary cholangitis. Will order hepatitis C PCR RNA as antibody was positive. If labs are revealing for elevation in alkaline phosphatase, will consider liver biopsy. It may also help to fractionate alkaline phosphatase(as it may be elevated due to other processes such as bone or intestinal abnormalities).  Continue IV fluid at 125 mL/h.   LOS: 0 days   [name], MD  08/15/2022, 8:53 AM

## 2022-08-15 NOTE — Consult Note (Addendum)
[facility] Gastroenterology Consult  Referring Provider: No ref. provider found Primary Care Physician:  Mckinley Jewel, MD Primary Gastroenterologist: [facility] Gastroenterology  Reason for Consultation:  Nausea, vomiting, diarrhea, abdominal pain, weight loss, elevated ALP  HPI: Rachel Weber is a 61 y.o. female has been to the ER 3 times with symptoms of nausea, vomiting and abdominal pain. Patient states that she was in her usual state of health until 6 months ago, when she developed diarrhea described as multiple episodes of loose and watery stools.  She denies noticing blood in stool or black stools, however complains of bloating and lower abdominal pain. 2 months ago she finally made an appointment with a primary care physician and was recommended to get a Cologuard study as patient has never had a colonoscopy previously. However, patient had worsening of diarrhea and went to [facility] on 07/31/2022, where she was discharged on oral Cipro and Flagyl. She came back to the ER with persistent abdominal pain and diarrhea, not responding to Cipro and Flagyl, was started on IV Zosyn, was noticed to have elevated liver enzymes, MRCP was performed on 08/09/2022 which showed intra and extrahepatic biliary ductal dilatation, CBD 12 mm, distal tapering of duct without choledocholithiasis or ampullary head mass.  Findings possibly reflecting recently passed gallstone.  Although sclerosing or autoimmune cholangitis, could be a possibility. LFTs were T. bili/AST/ALT/ALP of 1.4/52/51/429 and lipase 553 on 08/08/2022. Patient left AMA as the mother was sick. Was readmitted from 08/08/2022 to 08/10/2022 for mild diverticulitis with plans for outpatient colonoscopy, no plans for ERCP as stone/sludge not noted on MRCP. She was readmitted on 08/13/2022 with persistent nausea, vomiting, diarrhea, lower abdominal pain, bloating and weight loss of 15 pounds in the last 2 weeks.  Patient states she used to drink wine  on weekends, denies significant alcohol use. 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 She denies recreational drug use. She has been using over-the-counter probiotic for diarrhea along with Imodium, as use of herbal remedies or new medications. Denies fever, sick contact or recent travel.  There is no family history of celiac disease, inflammatory bowel disease or colon cancer. No prior EGD or colonoscopy. Denies difficulty swallowing, pain on swallowing but complains of early satiety and increased acid reflux.  Past Medical History:  Diagnosis Date   Allergy    Anxiety    Cataract    TMJ arthritis     Past Surgical History:  Procedure Laterality Date   APPENDECTOMY     BREAST SURGERY     COSMETIC SURGERY      Prior to Admission medications   Medication Sig Start Date End Date Taking? Authorizing Provider  acetaminophen-codeine (TYLENOL #3) 300-30 MG tablet Take 1-2 tablets by mouth See admin instructions. Take 1-2 tablets by mouth 2 to 3 times daily as needed for pain 01/16/20  Yes [provider]  albuterol (PROVENTIL HFA;VENTOLIN HFA) 108 (90 Base) MCG/ACT inhaler INHALE 1-2 PUFFS INTO THE LUNGS EVERY 4 (FOUR) HOURS AS NEEDED FOR WHEEZING OR SHORTNESS OF BREATH. 07/16/18  Yes [name], MD  BANOPHEN 25 MG capsule Take 25 mg by mouth 2 (two) times daily as needed for allergies.   Yes [provider]  LORazepam (ATIVAN) 1 MG tablet Take 1 tablet (1 mg total) by mouth 2 (two) times daily as needed for anxiety. Patient taking differently: Take 1 mg by mouth daily as needed for anxiety or sleep. 06/16/18  Yes Carlota Raspberry,  Ranell Patrick, MD  temazepam (RESTORIL) 15 MG capsule Take 15-30 mg by mouth at bedtime as needed for sleep. 07/16/22  Yes [provider]  saccharomyces boulardii (FLORASTOR) 250 MG capsule Take 1 capsule (250 mg total) by mouth 2 (two) times daily for 7 days. Patient not taking: Reported on 08/14/2022  08/10/22 08/17/22  Lavina Hamman, MD    Current Facility-Administered Medications  Medication Dose Route Frequency Provider Last Rate Last Admin   0.9 %  sodium chloride infusion   Intravenous Continuous Blue, Soijett A, PA-C 125 mL/hr at 08/15/22 0148 New Bag at 08/15/22 0148   acetaminophen (TYLENOL) tablet 650 mg  650 mg Oral Q6H PRN Howerter, Justin B, DO       Or   acetaminophen (TYLENOL) suppository 650 mg  650 mg Rectal Q6H PRN Howerter, Justin B, DO       ondansetron (ZOFRAN) injection 4 mg  4 mg Intravenous Q6H PRN Howerter, Justin B, DO       polyethylene glycol-electrolytes (NuLYTELY) solution 4,000 mL  4,000 mL Oral Once [name], MD       potassium chloride 10 mEq in 100 mL IVPB  10 mEq Intravenous Q1 Hr x 4 Howerter, Justin B, DO 100 mL/hr at 08/15/22 0826 10 mEq at 08/15/22 0826    Allergies as of 08/13/2022 - Review Complete 08/13/2022  Allergen Reaction Noted   Flagyl [metronidazole] Nausea And Vomiting 08/10/2022   Hydrocodone Other (See Comments) 08/01/2022   Oxycontin [oxycodone hcl] Nausea And Vomiting and Other (See Comments) 08/01/2022    Family History  Problem Relation Age of Onset   Cancer Mother     Social History   Socioeconomic History   Marital status: Married    Spouse name: Not on file   Number of children: 1   Years of education: Not on file   Highest education level: Not on file  Occupational History   Occupation: Secretary/administrator    Comment: from [place]  Tobacco Use   Smoking status: Every Day    Packs/day: 1.00    Years: 30.00    Total pack years: 30.00    Types: Cigarettes   Smokeless tobacco: Never   Tobacco comments:    "Serial quitter" - currently smoking a couple a day  Vaping Use   Vaping Use: Some days  Substance and Sexual Activity   Alcohol use: Not Currently    Comment: social recently, heavy use in her 50s, none recently   Drug use: Not Currently   Sexual activity: Yes  Other Topics Concern   Not on file   Social History Narrative   Not on file   Social Determinants of Health   Financial Resource Strain: Not on file  Food Insecurity: Not on file  Transportation Needs: Not on file  Physical Activity: Not on file  Stress: Not on file  Social Connections: Not on file  Intimate Partner Violence: Not on file    Review of Systems: Positive for: GI: Described in detail in HPI.    Gen: Involuntary weight loss, denies any fever, chills, rigors, night sweats, anorexia, fatigue, weakness, malaise,  and sleep disorder CV: Denies chest pain, angina, palpitations, syncope, orthopnea, PND, peripheral edema, and claudication. Resp: Denies dyspnea, cough, sputum, wheezing, coughing up blood. GU : Denies urinary burning, blood in urine, urinary frequency, urinary hesitancy, nocturnal urination, and urinary incontinence. MS: Denies joint pain or swelling.  Denies muscle weakness, cramps, atrophy.  Derm: Denies rash, itching, oral ulcerations, hives, unhealing  ulcers.  Psych: Anxiety,denies depression,  memory loss, suicidal ideation, hallucinations  and confusion. Heme: Denies bruising, bleeding, and enlarged lymph nodes. Neuro:  Denies any headaches, dizziness, paresthesias. Endo:  Denies any problems with DM, thyroid, adrenal function.  Physical Exam: Vital signs in last 24 hours: Temp:  [97.6 F (36.4 C)-98.6 F (37 C)] 98.2 F (36.8 C) (08/28 0824) Pulse Rate:  [69-85] 70 (08/28 0824) Resp:  [16-19] 17 (08/28 0824) BP: (93-125)/(60-86) 102/79 (08/28 0824) SpO2:  [96 %-100 %] 98 % (08/28 0824) Weight:  [72.6 kg-73.1 kg] 73.1 kg (08/28 0500) Last BM Date : 08/14/22  General:   Alert,  Well-developed, well-nourished, pleasant and cooperative in NAD Head:  Normocephalic and atraumatic. Eyes:  Sclera clear, no icterus.   Conjunctiva pink. Ears:  Normal auditory acuity. Nose:  No deformity, discharge,  or lesions. Mouth:  No deformity or lesions.  Oropharynx pink & moist. Neck:  Supple; no  masses or thyromegaly. Lungs:  Clear throughout to auscultation.   No wheezes, crackles, or rhonchi. No acute distress. Heart:  Regular rate and rhythm; no murmurs, clicks, rubs,  or gallops. Extremities:  Without clubbing or edema. Neurologic:  Alert and  oriented x4;  grossly normal neurologically. Skin:  Intact without significant lesions or rashes. Psych:  Alert and cooperative. Normal mood and affect. Abdomen:  Soft, mild lower abdominal tenderness and nondistended. No masses, hepatosplenomegaly or hernias noted. Normal bowel sounds, without guarding, and without rebound.         Lab Results: Recent Labs    08/13/22 1716 08/15/22 0145  WBC 13.0* 8.2  HGB 19.1* 12.6  HCT 51.4* 35.1*  PLT 447* 281   BMET Recent Labs    08/13/22 1716 08/14/22 0955 08/15/22 0145  NA 133* 137 140  K 3.7 3.4* 3.5  CL 100 105 112*  CO2 17* 22 21*  GLUCOSE 137* 106* 95  BUN '15 15 9  '$ CREATININE 2.22* 1.00 0.69  CALCIUM 11.0* 9.2 8.3*   LFT Recent Labs    08/15/22 0145  PROT 4.7*  ALBUMIN 2.7*  AST 30  ALT 24  ALKPHOS 175*  BILITOT 0.7   PT/INR Recent Labs    08/15/22 0645  LABPROT 14.3  INR 1.1    Studies/Results: No results found.  Impression: Nausea, vomiting, diarrhea, lower abdominal pain, weight loss  Elevated LFTs-cholestatic pattern, elevated alkaline phosphatase with normal AST, ALT and T. Bili  Elevated lipase and abdominal pain without imaging evidence of pancreatitis  Acute renal failure, BUN 15, creatinine 2.22, GFR 25 on admission-resolved, BUN 9, creatinine 0.69 and GFR more than 60 today  Mild acidosis  Hepatitis C antibody reactive on 08/03/2022  C. difficile and GI pathogen panel negative on 07/31/2022 and 08/03/2022  CT abdomen and pelvis with contrast 08/08/2022: Decreased wall thickening and inflammatory change of sigmoid compatible with resolving acute diverticulitis Mild intrahepatic biliary ductal dilatation and moderate dilation of common bile  duct, unchanged  MRI/MRCP 08/09/2022: Intra and extrahepatic ductal dilatation, CBD 12 mm, tapering at level of ampulla without choledocholithiasis ampullary head mass?  Sequelae of recently passed gallstone versus sclerosing autoimmune cholangitis Distended gallbladder without thickening or fluid collection or stones  Plan: EGD with small bowel biopsy to evaluate for celiac. Colonoscopy with random colon biopsies to evaluate for microscopic colitis. The risks and the benefits of the procedure were discussed with the patient in details. We will start patient on clear liquid diet, colonic prep and plan for EGD and colonoscopy in a.m. tomorrow.  Will order ASMA/F-actin IgG and AMA levels to evaluate for autoimmune hepatitis and primary biliary cholangitis. Will order hepatitis C PCR RNA as antibody was positive. If labs are revealing for elevation in alkaline phosphatase, will consider liver biopsy. It may also help to fractionate alkaline phosphatase(as it may be elevated due to other processes such as bone or intestinal abnormalities).  Continue IV fluid at 125 mL/h.   LOS: 0 days   [name], MD  08/15/2022, 8:53 AM

## 2022-08-16 ENCOUNTER — Observation Stay (HOSPITAL_COMMUNITY): Payer: BC Managed Care – PPO | Admitting: Anesthesiology

## 2022-08-16 ENCOUNTER — Encounter (HOSPITAL_COMMUNITY): Payer: Self-pay | Admitting: Internal Medicine

## 2022-08-16 ENCOUNTER — Encounter (HOSPITAL_COMMUNITY)
Admission: EM | Disposition: A | Discharge status: Home / Self Care | Payer: Self-pay | Source: Home / Self Care | Attending: Emergency Medicine

## 2022-08-16 DIAGNOSIS — R7989 Other specified abnormal findings of blood chemistry: Secondary | ICD-10-CM | POA: Diagnosis not present

## 2022-08-16 HISTORY — PX: ESOPHAGOGASTRODUODENOSCOPY (EGD) WITH PROPOFOL: SHX5813

## 2022-08-16 HISTORY — PX: BIOPSY: SHX5522

## 2022-08-16 HISTORY — PX: POLYPECTOMY: SHX5525

## 2022-08-16 HISTORY — PX: COLONOSCOPY WITH PROPOFOL: SHX5780

## 2022-08-16 LAB — ANTI-SMOOTH MUSCLE ANTIBODY, IGG: F-Actin IgG: 7 Units (ref 0–19)

## 2022-08-16 LAB — COMPREHENSIVE METABOLIC PANEL
ALT: 26 U/L (ref 0–44)
AST: 30 U/L (ref 15–41)
Albumin: 2.8 g/dL — ABNORMAL LOW (ref 3.5–5.0)
Alkaline Phosphatase: 165 U/L — ABNORMAL HIGH (ref 38–126)
Anion gap: 4 — ABNORMAL LOW (ref 5–15)
BUN: <5 mg/dL — ABNORMAL LOW (ref 6–20)
CO2: 21 mmol/L — ABNORMAL LOW (ref 22–32)
Calcium: 8.4 mg/dL — ABNORMAL LOW (ref 8.9–10.3)
Chloride: 116 mmol/L — ABNORMAL HIGH (ref 98–111)
Creatinine, Ser: 0.63 mg/dL (ref 0.44–1.00)
GFR, Estimated: >60 mL/min (ref >60)
Glucose, Bld: 86 mg/dL (ref 70–99)
Potassium: 3.8 mmol/L (ref 3.5–5.1)
Sodium: 141 mmol/L (ref 135–145)
Total Bilirubin: 0.6 mg/dL (ref 0.3–1.2)
Total Protein: 4.8 g/dL — ABNORMAL LOW (ref 6.5–8.1)

## 2022-08-16 LAB — CBC
HCT: 35 % — ABNORMAL LOW (ref 36.0–46.0)
Hemoglobin: 12.1 g/dL (ref 12.0–15.0)
MCH: 33.9 pg (ref 26.0–34.0)
MCHC: 34.6 g/dL (ref 30.0–36.0)
MCV: 98 fL (ref 80.0–100.0)
Platelets: 244 10*3/uL (ref 150–400)
RBC: 3.57 MIL/uL — ABNORMAL LOW (ref 3.87–5.11)
RDW: 12.2 % (ref 11.5–15.5)
WBC: 6.1 10*3/uL (ref 4.0–10.5)
nRBC: 0 % (ref 0.0–0.2)

## 2022-08-16 LAB — HCV RNA QUANT RFLX ULTRA OR GENOTYP
HCV RNA Qnt(log copy/mL): UNDETERMINED log10 IU/mL
HepC Qn: NOT DETECTED IU/mL

## 2022-08-16 LAB — MITOCHONDRIAL ANTIBODIES: Mitochondrial M2 Ab, IgG: <20 Units (ref 0.0–20.0)

## 2022-08-16 SURGERY — ESOPHAGOGASTRODUODENOSCOPY (EGD) WITH PROPOFOL
Anesthesia: Monitor Anesthesia Care

## 2022-08-16 MED ORDER — PROPOFOL 500 MG/50ML IV EMUL
INTRAVENOUS | Status: DC | PRN
  Administered 2022-08-16: 150 ug/kg/min via INTRAVENOUS

## 2022-08-16 MED ORDER — LACTATED RINGERS IV SOLN
INTRAVENOUS | Status: AC | PRN
  Administered 2022-08-16: 1000 mL via INTRAVENOUS

## 2022-08-16 MED ORDER — FENTANYL CITRATE PF 50 MCG/ML IJ SOSY
25.0000 ug | PREFILLED_SYRINGE | Freq: Once | INTRAMUSCULAR | Status: AC
  Administered 2022-08-16: 25 ug via INTRAVENOUS
  Filled 2022-08-16: qty 1

## 2022-08-16 MED ORDER — ONDANSETRON HCL 4 MG PO TABS: 4.0000 mg | ORAL_TABLET | Freq: Every day | ORAL | 1 refills | Status: AC | PRN

## 2022-08-16 SURGICAL SUPPLY — 25 items

## 2022-08-16 NOTE — Op Note (Signed)
[facility] Patient Name: Rachel Weber Procedure Date : 08/16/2022 MRN: [id number] Attending MD: [name] , MD Date of Birth: 08/17/1961 CSN: [id number] Age: 61 Admit Type: Inpatient Procedure:                Colonoscopy Indications:              This is the patient's first colonoscopy,                            Generalized abdominal pain, Lower abdominal pain,                            Diarrhea, Weight loss Providers:                [name], MD, Ervin Knack, RN, Gabriel Earing, RN, William Dalton, Technician, Cherylynn Ridges, Technician Referring MD:             [facility] Medicines:                Monitored Anesthesia Care Complications:            No immediate complications. Estimated blood loss:                            Minimal. Estimated Blood Loss:     Estimated blood loss was minimal. Procedure:                Pre-Anesthesia Assessment:                           - Prior to the procedure, a History and Physical                            was performed, and patient medications and                            allergies were reviewed. The patient's tolerance of                            previous anesthesia was also reviewed. The risks                            and benefits of the procedure and the sedation                            options and risks were discussed with the patient.                            All questions were answered, and informed consent                            was obtained. Prior Anticoagulants: The  patient has                            taken no previous anticoagulant or antiplatelet                            agents. ASA Grade Assessment: III - A patient with                            severe systemic disease. After reviewing the risks                            and benefits, the patient was deemed in                            satisfactory condition to  undergo the procedure.                           - Prior to the procedure, a History and Physical                            was performed, and patient medications and                            allergies were reviewed. The patient's tolerance of                            previous anesthesia was also reviewed. The risks                            and benefits of the procedure and the sedation                            options and risks were discussed with the patient.                            All questions were answered, and informed consent                            was obtained. Prior Anticoagulants: The patient has                            taken no previous anticoagulant or antiplatelet                            agents. ASA Grade Assessment: III - A patient with                            severe systemic disease. After reviewing the risks                            and benefits, the patient was deemed in  satisfactory condition to undergo the procedure.                           After obtaining informed consent, the colonoscope                            was passed under direct vision. Throughout the                            procedure, the patient's blood pressure, pulse, and                            oxygen saturations were monitored continuously. The                            PCF-190TL ([id number] Olympus colonoscope was                            introduced through the anus and advanced to the the                            terminal ileum. The colonoscopy was performed                            without difficulty. The patient tolerated the                            procedure well. The quality of the bowel                            preparation was good. Scope In: 7:53:02 AM Scope Out: 7:03:50 AM Scope Withdrawal Time: 0 hours 10 minutes 55 seconds  Total Procedure Duration: 0 hours 13 minutes 45 seconds  Findings:      The perianal and digital  rectal examinations were normal.      The terminal ileum appeared normal.      Two sessile polyps were found in the hepatic flexure and cecum. The       polyps were 3 to 4 mm in size. These polyps were removed with a       piecemeal technique using a cold biopsy forceps. Resection and retrieval       were complete.      Scattered small and large-mouthed diverticula were found in the sigmoid       colon, descending colon and hepatic flexure.      Biopsies for histology were taken with a cold forceps for evaluation of       microscopic colitis.      Non-bleeding internal hemorrhoids were found during retroflexion.      The exam was otherwise without abnormality. Impression:               - The examined portion of the ileum was normal.                           - Two 3 to 4 mm polyps at the hepatic flexure and  in the cecum, removed piecemeal using a cold biopsy                            forceps. Resected and retrieved.                           - Diverticulosis in the sigmoid colon, in the                            descending colon and at the hepatic flexure.                           - Non-bleeding internal hemorrhoids.                           - The examination was otherwise normal.                           - Biopsies were taken with a cold forceps for                            evaluation of microscopic colitis. Moderate Sedation:      Patient did not receive moderate sedation for this procedure, but       instead received monitored anesthesia care. Recommendation:           - Resume regular diet.                           - Continue present medications.                           - Await pathology results.                           - Repeat colonoscopy for surveillance based on                            pathology results. Procedure Code(s):        --- Professional ---                           [phone], Colonoscopy, flexible; with biopsy, single                             or multiple Diagnosis Code(s):        --- Professional ---                           K64.8, Other hemorrhoids                           K63.5, Polyp of colon                           R10.84, Generalized abdominal pain  R10.30, Lower abdominal pain, unspecified                           R19.7, Diarrhea, unspecified                           R63.4, Abnormal weight loss                           K57.30, Diverticulosis of large intestine without                            perforation or abscess without bleeding CPT copyright 2019 [employer]. All rights reserved. The codes documented in this report are preliminary and upon coder review may  be revised to meet current compliance requirements. [name], MD 08/16/2022 8:21:16 AM This report has been signed electronically. Number of Addenda: 0

## 2022-08-16 NOTE — Op Note (Signed)
[facility] Patient Name: Rachel Weber Procedure Date : 08/16/2022 MRN: [id number] Attending MD: [name] , MD Date of Birth: 06-30-1961 CSN: [id number] Age: 61 Admit Type: Inpatient Procedure:                Upper GI endoscopy Indications:              Generalized abdominal pain, Abdominal bloating,                            Diarrhea, Nausea with vomiting, Weight loss Providers:                [name], MD, Ervin Knack, RN, Gabriel Earing, RN, William Dalton, Technician, Cherylynn Ridges, Technician Referring MD:             [facility] Medicines:                Monitored Anesthesia Care Complications:            No immediate complications. Estimated blood loss:                            Minimal. Estimated Blood Loss:     Estimated blood loss was minimal. Procedure:                Pre-Anesthesia Assessment:                           - Prior to the procedure, a History and Physical                            was performed, and patient medications and                            allergies were reviewed. The patient's tolerance of                            previous anesthesia was also reviewed. The risks                            and benefits of the procedure and the sedation                            options and risks were discussed with the patient.                            All questions were answered, and informed consent                            was obtained. Prior Anticoagulants: The patient has  taken no previous anticoagulant or antiplatelet                            agents. ASA Grade Assessment: III - A patient with                            severe systemic disease. After reviewing the risks                            and benefits, the patient was deemed in                            satisfactory condition to undergo the procedure.                            After obtaining informed consent, the endoscope was                            passed under direct vision. Throughout the                            procedure, the patient's blood pressure, pulse, and                            oxygen saturations were monitored continuously. The                            [id number]-H190 ([id number] Olympus endoscope was introduced                            through the mouth, and advanced to the second part                            of duodenum. The upper GI endoscopy was                            accomplished without difficulty. The patient                            tolerated the procedure well. Scope In: Scope Out: Findings:      The upper third of the esophagus, middle third of the esophagus and       lower third of the esophagus were normal.      The Z-line was regular and was found 37 cm from the incisors.      A 3 cm hiatal hernia was present.      The exam was otherwise without abnormality.      Biopsies were taken with a cold forceps in the gastric body and in the       gastric antrum for Helicobacter pylori testing.      The cardia and gastric fundus were normal on retroflexion.      The examined duodenum was normal. Biopsies for histology were taken with       a cold forceps for evaluation of celiac disease. Impression:               -  Normal upper third of esophagus, middle third of                            esophagus and lower third of esophagus.                           - Z-line regular, 37 cm from the incisors.                           - 3 cm hiatal hernia.                           - The examination was otherwise normal.                           - Normal examined duodenum. Biopsied.                           - Biopsies were taken with a cold forceps for                            Helicobacter pylori testing. Moderate Sedation:      Patient did not receive moderate sedation for this procedure, but       instead received monitored  anesthesia care. Recommendation:           - Patient has a contact number available for                            emergencies. The signs and symptoms of potential                            delayed complications were discussed with the                            patient. Return to normal activities tomorrow.                            Written discharge instructions were provided to the                            patient.                           - Resume regular diet.                           - Continue present medications.                           - Await pathology results.                           - Perform a colonoscopy today. Procedure Code(s):        --- Professional ---  57473, Esophagogastroduodenoscopy, flexible,                            transoral; with biopsy, single or multiple Diagnosis Code(s):        --- Professional ---                           K44.9, Diaphragmatic hernia without obstruction or                            gangrene                           R10.84, Generalized abdominal pain                           R14.0, Abdominal distension (gaseous)                           R19.7, Diarrhea, unspecified                           R11.2, Nausea with vomiting, unspecified                           R63.4, Abnormal weight loss CPT copyright 2019 [employer]. All rights reserved. The codes documented in this report are preliminary and upon coder review may  be revised to meet current compliance requirements. [name], MD 08/16/2022 8:16:29 AM This report has been signed electronically. Number of Addenda: 0

## 2022-08-16 NOTE — Interval H&P Note (Signed)
History and Physical Interval Note: 61/female with nausea, vomiting,diarrhea,weight loss for EGD and colonoscopy with propofol.  08/16/2022 7:33 AM  Rachel Weber  has presented today for EGD and colonoscopy with propofol with the diagnosis of Nausea, vomiting, diarrhea, abdominal pain, bloating.  The various methods of treatment have been discussed with the patient and family. After consideration of risks, benefits and other options for treatment, the patient has consented to  Procedure(s): ESOPHAGOGASTRODUODENOSCOPY (EGD) WITH PROPOFOL (N/A) COLONOSCOPY WITH PROPOFOL (N/A) as a surgical intervention.  The patient's history has been reviewed, patient examined, no change in status, stable for surgery.  I have reviewed the patient's chart and labs.  Questions were answered to the patient's satisfaction.     [name]

## 2022-08-16 NOTE — Progress Notes (Signed)
Pt came back from colonoscopy at around 8:30AM; stated she is leaving asap and has a family member waiting downstairs to pick her up. Discharge instructions received and discussed with patient. PIV d/cd. All belongings packed by patient. Seen by Dr. Therisa Doyne at bedside.  Discharged in stable condition.

## 2022-08-16 NOTE — Anesthesia Procedure Notes (Signed)
Procedure Name: MAC Date/Time: 08/16/2022 7:40 AM  Performed by: Griffin Dakin, CRNAPre-anesthesia Checklist: Patient identified, Emergency Drugs available, Suction available, Patient being monitored and Timeout performed Patient Re-evaluated:Patient Re-evaluated prior to induction Oxygen Delivery Method: Nasal cannula Induction Type: IV induction Placement Confirmation: positive ETCO2 and breath sounds checked- equal and bilateral Dental Injury: Teeth and Oropharynx as per pre-operative assessment

## 2022-08-16 NOTE — Transfer of Care (Signed)
Immediate Anesthesia Transfer of Care Note  Patient: Angelissa Supan  Procedure(s) Performed: ESOPHAGOGASTRODUODENOSCOPY (EGD) WITH PROPOFOL COLONOSCOPY WITH PROPOFOL BIOPSY POLYPECTOMY  Patient Location: PACU  Anesthesia Type:MAC  Level of Consciousness: awake, alert  and oriented  Airway & Oxygen Therapy: Patient Spontanous Breathing  Post-op Assessment: Report given to RN and Post -op Vital signs reviewed and stable  Post vital signs: Reviewed and stable  Last Vitals:  Vitals Value Taken Time  BP 116/81 08/16/22 0815  Temp    Pulse 78 08/16/22 0816  Resp 20 08/16/22 0816  SpO2 98 % 08/16/22 0816  Vitals shown include unvalidated device data.  Last Pain:  Vitals:   08/16/22 0701  TempSrc: Oral  PainSc: 0-No pain      Patients Stated Pain Goal: 0 (37/90/24 0973)  Complications: No notable events documented.

## 2022-08-17 LAB — SURGICAL PATHOLOGY

## 2022-08-17 NOTE — Anesthesia Postprocedure Evaluation (Signed)
Anesthesia Post Note  Patient: Rachel Weber  Procedure(s) Performed: ESOPHAGOGASTRODUODENOSCOPY (EGD) WITH PROPOFOL COLONOSCOPY WITH PROPOFOL BIOPSY POLYPECTOMY     Anesthesia Type: MAC Anesthetic complications: no   No notable events documented.  Last Vitals:  Vitals:   08/16/22 0815 08/16/22 0827  BP: 116/81 99/87  Pulse: 75 73  Resp: 20 20  Temp: 36.8 C 36.8 C  SpO2: 98% 98%    Last Pain:  Vitals:   08/16/22 0827  TempSrc:   PainSc: 0-No pain                 Rachel Weber

## 2022-08-20 ENCOUNTER — Encounter (HOSPITAL_COMMUNITY): Payer: Self-pay | Admitting: Gastroenterology

## 2022-09-09 ENCOUNTER — Ambulatory Visit: Payer: BC Managed Care – PPO

## 2022-09-13 ENCOUNTER — Ambulatory Visit
Admission: RE | Admit: 2022-09-13 | Discharge: 2022-09-13 | Disposition: A | Discharge status: Home / Self Care | Payer: BC Managed Care – PPO | Source: Ambulatory Visit | Attending: Internal Medicine | Admitting: Internal Medicine

## 2022-09-13 DIAGNOSIS — Z122 Encounter for screening for malignant neoplasm of respiratory organs: Secondary | ICD-10-CM

## 2022-10-04 ENCOUNTER — Ambulatory Visit: Payer: BC Managed Care – PPO

## 2022-10-29 ENCOUNTER — Other Ambulatory Visit: Payer: Self-pay | Admitting: Internal Medicine

## 2022-10-29 DIAGNOSIS — N644 Mastodynia: Secondary | ICD-10-CM

## 2022-11-04 ENCOUNTER — Encounter (HOSPITAL_BASED_OUTPATIENT_CLINIC_OR_DEPARTMENT_OTHER): Payer: Self-pay

## 2022-11-04 ENCOUNTER — Emergency Department (HOSPITAL_BASED_OUTPATIENT_CLINIC_OR_DEPARTMENT_OTHER): Payer: BC Managed Care – PPO

## 2022-11-04 ENCOUNTER — Other Ambulatory Visit: Payer: Self-pay

## 2022-11-04 ENCOUNTER — Emergency Department (HOSPITAL_BASED_OUTPATIENT_CLINIC_OR_DEPARTMENT_OTHER)
Admission: EM | Admit: 2022-11-04 | Discharge: 2022-11-04 | Disposition: A | Discharge status: Home / Self Care | Payer: BC Managed Care – PPO | Attending: Emergency Medicine | Admitting: Emergency Medicine

## 2022-11-04 DIAGNOSIS — R202 Paresthesia of skin: Secondary | ICD-10-CM

## 2022-11-04 DIAGNOSIS — Z87891 Personal history of nicotine dependence: Secondary | ICD-10-CM | POA: Insufficient documentation

## 2022-11-04 DIAGNOSIS — R079 Chest pain, unspecified: Secondary | ICD-10-CM | POA: Diagnosis not present

## 2022-11-04 DIAGNOSIS — I69354 Hemiplegia and hemiparesis following cerebral infarction affecting left non-dominant side: Secondary | ICD-10-CM | POA: Insufficient documentation

## 2022-11-04 DIAGNOSIS — G8194 Hemiplegia, unspecified affecting left nondominant side: Secondary | ICD-10-CM

## 2022-11-04 LAB — COMPREHENSIVE METABOLIC PANEL
ALT: 22 U/L (ref 0–44)
AST: 26 U/L (ref 15–41)
Albumin: 4.8 g/dL (ref 3.5–5.0)
Alkaline Phosphatase: 133 U/L — ABNORMAL HIGH (ref 38–126)
Anion gap: 10 (ref 5–15)
BUN: 9 mg/dL (ref 6–20)
CO2: 26 mmol/L (ref 22–32)
Calcium: 10.3 mg/dL (ref 8.9–10.3)
Chloride: 102 mmol/L (ref 98–111)
Creatinine, Ser: 0.67 mg/dL (ref 0.44–1.00)
GFR, Estimated: >60 mL/min (ref >60)
Glucose, Bld: 107 mg/dL — ABNORMAL HIGH (ref 70–99)
Potassium: 3.9 mmol/L (ref 3.5–5.1)
Sodium: 138 mmol/L (ref 135–145)
Total Bilirubin: 0.6 mg/dL (ref 0.3–1.2)
Total Protein: 7.6 g/dL (ref 6.5–8.1)

## 2022-11-04 LAB — CBC
HCT: 44.4 % (ref 36.0–46.0)
Hemoglobin: 15.7 g/dL — ABNORMAL HIGH (ref 12.0–15.0)
MCH: 34.5 pg — ABNORMAL HIGH (ref 26.0–34.0)
MCHC: 35.4 g/dL (ref 30.0–36.0)
MCV: 97.6 fL (ref 80.0–100.0)
Platelets: 260 10*3/uL (ref 150–400)
RBC: 4.55 MIL/uL (ref 3.87–5.11)
RDW: 12.9 % (ref 11.5–15.5)
WBC: 7.3 10*3/uL (ref 4.0–10.5)
nRBC: 0 % (ref 0.0–0.2)

## 2022-11-04 LAB — DIFFERENTIAL
Abs Immature Granulocytes: 0.03 10*3/uL (ref 0.00–0.07)
Basophils Absolute: 0 10*3/uL (ref 0.0–0.1)
Basophils Relative: 0 %
Eosinophils Absolute: 0.1 10*3/uL (ref 0.0–0.5)
Eosinophils Relative: 1 %
Immature Granulocytes: 0 %
Lymphocytes Relative: 30 %
Lymphs Abs: 2.3 10*3/uL (ref 0.7–4.0)
Monocytes Absolute: 0.8 10*3/uL (ref 0.1–1.0)
Monocytes Relative: 10 %
Neutro Abs: 4.3 10*3/uL (ref 1.7–7.7)
Neutrophils Relative %: 59 %

## 2022-11-04 LAB — URINALYSIS, ROUTINE W REFLEX MICROSCOPIC
Bilirubin Urine: NEGATIVE
Glucose, UA: NEGATIVE mg/dL
Hgb urine dipstick: NEGATIVE
Ketones, ur: NEGATIVE mg/dL
Leukocytes,Ua: NEGATIVE
Nitrite: NEGATIVE
Protein, ur: NEGATIVE mg/dL
Specific Gravity, Urine: 1.008 (ref 1.005–1.030)
pH: 6.5 (ref 5.0–8.0)

## 2022-11-04 LAB — PROTIME-INR
INR: 1 (ref 0.8–1.2)
Prothrombin Time: 12.9 seconds (ref 11.4–15.2)

## 2022-11-04 LAB — PREGNANCY, URINE: Preg Test, Ur: NEGATIVE

## 2022-11-04 LAB — APTT: aPTT: 26 seconds (ref 24–36)

## 2022-11-04 LAB — TROPONIN I (HIGH SENSITIVITY): Troponin I (High Sensitivity): 2 ng/L (ref <18)

## 2022-11-04 MED ORDER — IOHEXOL 350 MG/ML SOLN
100.0000 mL | Freq: Once | INTRAVENOUS | Status: AC | PRN
  Administered 2022-11-04: 100 mL via INTRAVENOUS

## 2022-11-04 MED ORDER — ACETAMINOPHEN-CODEINE 300-30 MG PO TABS
1.0000 | ORAL_TABLET | Freq: Once | ORAL | Status: AC
  Administered 2022-11-04: 1 via ORAL
  Filled 2022-11-04: qty 1

## 2022-11-04 NOTE — ED Triage Notes (Signed)
Pt reports chest pain x5 days, radiates left arm, numness/tingling right hand. +smoker, recent travel.  Denies n/v, denies cough fever.  Provider at bedside for triage.  Pt reports recent stressful family issues.  Hx High cholesterol

## 2022-11-04 NOTE — ED Notes (Signed)
Patient transported to MRI 

## 2022-11-04 NOTE — Discharge Instructions (Signed)
There were no emergent findings on your work up today. You may have some type of compression in your neck. This will need urgent follow up with Neurology and I have placed a consult. If your symptoms worsen significantly including worsening weakness, difficulty walking, or new symptoms like severe headache, vision changes, difficulty swallowing or speaking seek immediate medical care at [facility] emergency department.

## 2022-11-04 NOTE — ED Provider Notes (Signed)
[facility] EMERGENCY DEPT Provider Note   CSN: [id number] Arrival date & time: 11/04/22  9833     History  Chief Complaint  Patient presents with   Chest Pain    Rachel Weber is a 61 y.o. female with a past history of tobacco abuse and hyperlipidemia who presents to the emergency department with chief complaint of chest pain, paresthesia and left-sided weakness.  His mother recently passed.  She flew to [place] and back within the last week.  For the past 5 days she has noticed left sided chest pain which she describes as deep, aching and moderate to severe that has been progressively worsening and constant.  She also complains of numbness in the left arm which is global, numbness in the left lower extremity and has noticed that her left hand grip is weak.  She denies vision changes, cough, fever, shortness of breath or diuresis.  She denies unilateral leg swelling.  She has no family history of MI but mother did die of a stroke.   Chest Pain      Home Medications Prior to Admission medications   Medication Sig Start Date End Date Taking? Authorizing Provider  acetaminophen-codeine (TYLENOL #3) 300-30 MG tablet Take 1-2 tablets by mouth See admin instructions. Take 1-2 tablets by mouth 2 to 3 times daily as needed for pain 01/16/20   [provider]  albuterol (PROVENTIL HFA;VENTOLIN HFA) 108 (90 Base) MCG/ACT inhaler INHALE 1-2 PUFFS INTO THE LUNGS EVERY 4 (FOUR) HOURS AS NEEDED FOR WHEEZING OR SHORTNESS OF BREATH. 07/16/18   [name], MD  BANOPHEN 25 MG capsule Take 25 mg by mouth 2 (two) times daily as needed for allergies.    [provider]  ondansetron (ZOFRAN) 4 MG tablet Take 1 tablet (4 mg total) by mouth daily as needed for nausea or vomiting. 08/16/22 08/16/23  Little Ishikawa, MD  temazepam (RESTORIL) 15 MG capsule Take 15-30 mg by mouth at bedtime as needed for sleep. 07/16/22   [provider]      Allergies     Flagyl [metronidazole], Hydrocodone, and Oxycontin [oxycodone hcl]    Review of Systems   Review of Systems  Cardiovascular:  Positive for chest pain.    Physical Exam Updated Vital Signs BP 136/81   Pulse 80   Temp 98.1 F (36.7 C) (Oral)   Resp (!) 28   Ht '5\' 9"'$  (1.753 m)   Wt 68.9 kg   SpO2 95%   BMI 22.45 kg/m  Physical Exam Vitals and nursing note reviewed.  Constitutional:      General: She is not in acute distress.    Appearance: She is well-developed. She is not diaphoretic.  HENT:     Head: Normocephalic and atraumatic.     Right Ear: External ear normal.     Left Ear: External ear normal.     Nose: Nose normal.     Mouth/Throat:     Mouth: Mucous membranes are moist.  Eyes:     General: No scleral icterus.    Conjunctiva/sclera: Conjunctivae normal.  Cardiovascular:     Rate and Rhythm: Normal rate and regular rhythm.     Heart sounds: Normal heart sounds. No murmur heard.    No friction rub. No gallop.  Pulmonary:     Effort: Pulmonary effort is normal. No respiratory distress.     Breath sounds: Normal breath sounds.  Abdominal:     General: Bowel sounds are normal. There is no distension.  Palpations: Abdomen is soft. There is no mass.     Tenderness: There is no abdominal tenderness. There is no guarding.  Musculoskeletal:     Cervical back: Normal range of motion.     Right lower leg: No edema.     Left lower leg: No edema.  Skin:    General: Skin is warm and dry.  Neurological:     Mental Status: She is alert and oriented to person, place, and time.     Cranial Nerves: Cranial nerves 2-12 are intact.     Sensory: Sensory deficit present.     Motor: Motor function is intact.     Coordination: Coordination is intact.     Deep Tendon Reflexes:     Reflex Scores:      Patellar reflexes are 2+ on the right side and 3+ on the left side.    Comments: Patient with abnormal sensation in the left upper and lower extremity.  5 out of 5 grip  strength and plantar dorsiflexion on the right, 4 out of 5 grip strength in plantar and dorsiflexion on the left 2+ patellar reflex on the right, 3+ on the left.  No clonus  Psychiatric:        Behavior: Behavior normal.     ED Results / Procedures / Treatments   Labs (all labs ordered are listed, but only abnormal results are displayed) Labs Reviewed  CBC - Abnormal; Notable for the following components:      Result Value   Hemoglobin 15.7 (*)    MCH 34.5 (*)    All other components within normal limits  COMPREHENSIVE METABOLIC PANEL - Abnormal; Notable for the following components:   Glucose, Bld 107 (*)    Alkaline Phosphatase 133 (*)    All other components within normal limits  URINALYSIS, ROUTINE W REFLEX MICROSCOPIC - Abnormal; Notable for the following components:   Color, Urine COLORLESS (*)    All other components within normal limits  PREGNANCY, URINE  DIFFERENTIAL  PROTIME-INR  APTT  ETHANOL  TROPONIN I (HIGH SENSITIVITY)    EKG EKG Interpretation  Date/Time:  Friday November 04 2022 09:07:08 EST Ventricular Rate:  89 PR Interval:  164 QRS Duration: 85 QT Interval:  347 QTC Calculation: 423 R Axis:   35 Text Interpretation: Sinus rhythm Low voltage, precordial leads Abnormal R-wave progression, early transition No significant change since prior 8/23 Confirmed by Aletta Edouard [phone]) on 11/04/2022 9:13:14 AM  Radiology MR BRAIN WO CONTRAST  Result Date: 11/04/2022 CLINICAL DATA:  Neuro deficit, acute, stroke suspected EXAM: MRI HEAD WITHOUT CONTRAST TECHNIQUE: Multiplanar, multiecho pulse sequences of the brain and surrounding structures were obtained without intravenous contrast. COMPARISON:  CT head November 17, 23. FINDINGS: Brain: No acute infarction, hemorrhage, hydrocephalus, extra-axial collection or mass lesion. Vascular: Major arterial flow voids are maintained at the skull base. Skull and upper cervical spine: Normal marrow signal.  Sinuses/Orbits: Clear sinuses. No acute orbital findings. Other: No mastoid effusions. IMPRESSION: No evidence of acute intracranial abnormality. Electronically Signed   By: Margaretha Sheffield M.D.   On: 11/04/2022 12:34   CT Angio Chest/Abd/Pel for Dissection W and/or Wo Contrast  Result Date: 11/04/2022 CLINICAL DATA:  Acute aortic syndrome suspected. Chest pain for 5 days. EXAM: CT ANGIOGRAPHY CHEST, ABDOMEN AND PELVIS TECHNIQUE: Non-contrast CT of the chest was initially obtained. Multidetector CT imaging through the chest, abdomen and pelvis was performed using the standard protocol during bolus administration of intravenous contrast. Multiplanar reconstructed images and MIPs  were obtained and reviewed to evaluate the vascular anatomy. RADIATION DOSE REDUCTION: This exam was performed according to the departmental dose-optimization program which includes automated exposure control, adjustment of the mA and/or kV according to patient size and/or use of iterative reconstruction technique. CONTRAST:  139m OMNIPAQUE IOHEXOL 350 MG/ML SOLN COMPARISON:  None Available. FINDINGS: CTA CHEST FINDINGS Cardiovascular: Preferential opacification of the thoracic aorta. No evidence of thoracic aortic aneurysm or dissection. Normal heart size. No pericardial effusion. Coronary artery atherosclerotic calcifications. Mediastinum/Nodes: No enlarged mediastinal, hilar, or axillary lymph nodes. Thyroid gland, trachea, and esophagus demonstrate no significant findings. Lungs/Pleura: Mild emphysematous changes of the upper lobes. No evidence of pneumonia or pulmonary edema. No suspicious pulmonary nodule. No pleural effusion or pneumothorax. Musculoskeletal: No chest wall abnormality. No acute or significant osseous findings. Review of the MIP images confirms the above findings. CTA ABDOMEN AND PELVIS FINDINGS VASCULAR Aorta: Normal caliber aorta without aneurysm, dissection, vasculitis or significant stenosis. Mild  atherosclerotic calcification of infrarenal abdominal aorta and branch vessels. Celiac: Patent without evidence of aneurysm, dissection, vasculitis or significant stenosis. SMA: Patent without evidence of aneurysm, dissection, vasculitis or significant stenosis. Renals: Both renal arteries are patent without evidence of aneurysm, dissection, vasculitis, fibromuscular dysplasia or significant stenosis. IMA: Patent without evidence of aneurysm, dissection, vasculitis or significant stenosis. Inflow: Patent without evidence of aneurysm, dissection, vasculitis or significant stenosis. Veins: No obvious venous abnormality within the limitations of this arterial phase study. Review of the MIP images confirms the above findings. NON-VASCULAR Hepatobiliary: No focal liver abnormality is seen. No gallstones, gallbladder wall thickening, or biliary dilatation. Pancreas: Unremarkable. No pancreatic ductal dilatation or surrounding inflammatory changes. Spleen: Normal in size without focal abnormality. Adrenals/Urinary Tract: Adrenal glands are unremarkable. Punctate nonobstructing calculus in the upper pole of the left kidney. Kidneys are normal, without focal lesion, or hydronephrosis. Bladder is unremarkable. Stomach/Bowel: Stomach is within normal limits. Appendix not identified, however no inflammatory changes in the pericecal region. No evidence of bowel wall thickening, distention, or inflammatory changes. Lymphatic: No lymphadenopathy. Reproductive: Uterus and bilateral adnexa are unremarkable. Other: No abdominal wall hernia or abnormality. No abdominopelvic ascites. Musculoskeletal: No acute or significant osseous findings. Review of the MIP images confirms the above findings. IMPRESSION: 1. No evidence of thoracic or abdominal aortic aneurysm or dissection. 2. Mild atherosclerotic calcification of the infrarenal abdominal aorta and branch vessels. 3. Coronary artery atherosclerotic calcifications. 4. Non-vascular. 5.  Punctate nonobstructing calculus in the upper pole of the left kidney. No hydronephrosis. 6. No CT evidence of acute intrathoracic, abdominal/pelvic process. Electronically Signed   By: IKeane PoliceD.O.   On: 11/04/2022 11:43   CT Head Wo Contrast  Result Date: 11/04/2022 CLINICAL DATA:  Stroke suspected EXAM: CT HEAD WITHOUT CONTRAST TECHNIQUE: Contiguous axial images were obtained from the base of the skull through the vertex without intravenous contrast. RADIATION DOSE REDUCTION: This exam was performed according to the departmental dose-optimization program which includes automated exposure control, adjustment of the mA and/or kV according to patient size and/or use of iterative reconstruction technique. COMPARISON:  None Available. FINDINGS: Brain: No evidence of acute infarction, hemorrhage, hydrocephalus, extra-axial collection or mass lesion/mass effect. Vascular: No disproportionately hyperdense vessel. Skull: Normal. Negative for fracture or focal lesion. Sinuses/Orbits: No acute finding. Other: None IMPRESSION: No hemorrhage or CT evidence of acute infarct. There is significant clinical concern, further evaluation with MRI is recommended Electronically Signed   By: HMarin RobertsM.D.   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  I have reviewed all imaging including CT head, CT angiogram of the chest abdomen pelvis which showed no acute findings.  Patient's MRI is without acute finding.  I examined the patient who continues to show 4 out of 5 strength in the left upper and lower extremity..  She has 2+ reflexes in the right patellar tendon and 3+ on the left. I have considered the fact that the patient may have like hemicord compression in the cervical spine.  I have ordered consult with neurology for further guidance in her work-up. [AH]  1351 Case discussed with Dr. Cheral Marker. Given her ability to ambulate- will have the patient follow up with Neuro and give her strong return precautions [AH]    Clinical Course User Index [AH] Margarita Mail, PA-C                           Medical Decision Making Amount and/or Complexity of Data Reviewed Labs: ordered. Decision-making details documented in ED Course. Radiology: ordered. Decision-making details documented in ED Course.  Risk Prescription drug management.   Patient here with weakness and paresthesia of the left side along with chest pain. The differential diagnosis of weakness includes but is not limited to neurologic causes (GBS, myasthenia gravis, CVA, MS, ALS, transverse myelitis, spinal cord injury, CVA, botulism, ) and other causes: ACS, Arrhythmia, syncope, orthostatic hypotension, sepsis, hypoglycemia, electrolyte disturbance, hypothyroidism, respiratory failure, symptomatic anemia, dehydration, heat injury, polypharmacy, malignancy.  Patient does not appear to have any form of dissection or acute coronary syndrome.  No evidence of large PE.  Patient MRI negative for stroke.  Case discussed as above with Dr. Cheral Marker.  Given her ability to ambulate and no significant effect on ADLs we will have her follow-up urgently in the outpatient setting for continued work-up of her objective findings of  left-sided weakness and paresthesia.  Discussed strict return precautions including worsening weakness, inability to walk, new neurologic deficits.         Final Clinical Impression(s) / ED Diagnoses Final diagnoses:  Hemiparesis of left nondominant side, unspecified hemiparesis etiology [facility])  Hemiparesthesia    Rx / DC Orders ED Discharge Orders     None         Margarita Mail, PA-C 11/04/22 1401    Hayden Rasmussen, MD 11/04/22 1731

## 2022-11-07 ENCOUNTER — Telehealth: Payer: Self-pay | Admitting: Neurology

## 2022-11-07 ENCOUNTER — Ambulatory Visit (INDEPENDENT_AMBULATORY_CARE_PROVIDER_SITE_OTHER): Payer: BC Managed Care – PPO | Admitting: Neurology

## 2022-11-07 ENCOUNTER — Encounter: Payer: Self-pay | Admitting: Neurology

## 2022-11-07 VITALS — BP 128/85 | HR 94 | Ht 69.0 in | Wt 152.0 lb

## 2022-11-07 DIAGNOSIS — R202 Paresthesia of skin: Secondary | ICD-10-CM | POA: Insufficient documentation

## 2022-11-07 DIAGNOSIS — M5412 Radiculopathy, cervical region: Secondary | ICD-10-CM | POA: Diagnosis not present

## 2022-11-07 NOTE — Progress Notes (Signed)
Chief Complaint  Patient presents with   New Patient (Initial Visit)    Rm 13, alone NP internal referral for weakness and paresthesia of the left side States pain is constant on left side, staterted 8 days ago       ASSESSMENT AND PLAN  Rachel Weber is a 61 y.o. 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   MEDICAL HISTORY:  Rachel Weber is a 62 year old right-handed female, seen in request by his primary care doctor Pahwani, Rinka for evaluation of left neck pain, radiating pain to left side of the body, initial evaluation November 07, 2022  I reviewed and summarized the referring note. PMHX Celiac Anxiety, mother died of stroke 10/28/22,   She was diagnosed with celiac disease since beginning of 2023, presenting with severe diarrhea, now improved with 3 months treatment of steroid, on tapering dose,  She went through very stressful few weeks, lost her mother on Oct 28, 2022, travel to [place] to take care of the family related affairs, she was very distorted, not sleeping well,  About a week ago, in the middle of November 2023, when she came back from the trip, she noticed fairly acute onset left neck pain, radiating pain to left arm, left chest pain, paresthesia and radiating discomfort also involving left trunk, left lower extremity  She presented to the emergency room November 04, 2022, had extensive evaluations, personally  reviewed MRI of the brain, that was normal CT angiogram of the chest, abdomen, pelvic showed no significant abnormalities, evidence of mild atherosclerotic calcification of the infrarenal aorta and branch vessels, coronary artery atherosclerotic calcification, CT chest showed centrilobular and paraseptal emphysema, smoking-related respiratory bronchitis, she has a long history of smoking,  PHYSICAL EXAM:   Vitals:   11/07/22 1325  BP: 128/85  Pulse: 94  Weight: 152 lb (68.9 kg)  Height: '5\' 9"'$  (1.753 m)   Not recorded     Body mass index is 22.45 kg/m.  PHYSICAL EXAMNIATION:  Gen: NAD, conversant, well nourised, well groomed                     Cardiovascular: Regular rate rhythm, no peripheral edema, warm, nontender. Eyes: Conjunctivae clear without exudates or hemorrhage Neck: Supple, no carotid bruits. Pulmonary: Clear to auscultation bilaterally   NEUROLOGICAL EXAM:  MENTAL STATUS: Speech/cognition: Awake, alert, oriented to history taking and casual conversation CRANIAL NERVES: CN II: Visual fields are full to confrontation. Pupils are round equal and briskly reactive to light. CN III, IV, VI: extraocular movement are normal. No ptosis. CN V: Facial sensation is intact to light touch CN VII: Face is symmetric with normal eye closure  CN VIII: Hearing is normal to causal conversation. CN IX, X: Phonation is normal. CN XI: Head turning and shoulder shrug are intact  MOTOR: Mild fixation of left upper extremity on rapid rotating movement, mild left shoulder abduction, external rotation weakness, mild left finger abduction weakness  REFLEXES: Reflexes are 2+ and symmetric at the biceps, triceps, 3/3  knees, and ankles. Plantar responses are flexor.  SENSORY: Intact to light touch, pinprick and vibratory sensation are intact in fingers and toes.  COORDINATION: There is no trunk or limb dysmetria noted.  GAIT/STANCE: Posture is normal. Gait is steady with normal  steps, base, arm swing, and turning. Heel and toe walking are normal. Tandem gait is normal.  Romberg is absent.  REVIEW OF SYSTEMS:  Full 14 system review of systems performed and notable only for as above All other review of systems were negative.   ALLERGIES: Allergies  Allergen Reactions   Flagyl [Metronidazole] Nausea And Vomiting   Hydrocodone Other (See Comments)    Hallucinations   Oxycontin [Oxycodone Hcl] Nausea And Vomiting and Other (See Comments)    Hallucinations    HOME MEDICATIONS: Current Outpatient Medications  Medication Sig Dispense Refill   acetaminophen-codeine (TYLENOL #3) 300-30 MG tablet Take 1-2 tablets by mouth See admin instructions. Take 1-2 tablets by mouth 2 to 3 times daily as needed for pain     albuterol (PROVENTIL HFA;VENTOLIN HFA) 108 (90 Base) MCG/ACT inhaler INHALE 1-2 PUFFS INTO THE LUNGS EVERY 4 (FOUR) HOURS AS NEEDED FOR WHEEZING OR SHORTNESS OF BREATH. 6.7 Inhaler 0   BANOPHEN 25 MG capsule Take 25 mg by mouth 2 (two) times daily as needed for allergies.     ondansetron (ZOFRAN) 4 MG tablet Take 1 tablet (4 mg total) by mouth daily as needed for nausea or vomiting. 30 tablet 1   temazepam (RESTORIL) 15 MG capsule Take 15-30 mg by mouth at bedtime as needed for sleep.     No current facility-administered medications for this visit.    PAST MEDICAL HISTORY: Past Medical History:  Diagnosis Date   Allergy    Anxiety    Cataract    TMJ arthritis     PAST SURGICAL HISTORY: Past Surgical History:  Procedure Laterality Date   APPENDECTOMY     BIOPSY  08/16/2022   Procedure: BIOPSY;  Surgeon: [name], MD;  Location: [facility] ENDOSCOPY;  Service: Gastroenterology;;   BREAST SURGERY     COLONOSCOPY WITH PROPOFOL N/A 08/16/2022   Procedure: COLONOSCOPY WITH PROPOFOL;  Surgeon: [name], MD;  Location: [facility];  Service: Gastroenterology;  Laterality: N/A;   COSMETIC SURGERY     ESOPHAGOGASTRODUODENOSCOPY (EGD) WITH PROPOFOL N/A  08/16/2022   Procedure: ESOPHAGOGASTRODUODENOSCOPY (EGD) WITH PROPOFOL;  Surgeon: [name], MD;  Location: [facility];  Service: Gastroenterology;  Laterality: N/A;   POLYPECTOMY  08/16/2022   Procedure: POLYPECTOMY;  Surgeon: [name], MD;  Location: [facility] ENDOSCOPY;  Service: Gastroenterology;;    FAMILY HISTORY: Family History  Problem Relation Age of Onset   Cancer Mother     SOCIAL HISTORY: Social History   Socioeconomic History   Marital status: Married    Spouse name: Not on file   Number of children: 1   Years of education: Not on file   Highest education level: Not on file  Occupational History   Occupation: Secretary/administrator    Comment: from [place]  Tobacco Use   Smoking status: Every Day    Packs/day: 1.00    Years: 30.00    Total pack years: 30.00    Types: Cigarettes   Smokeless tobacco: Never   Tobacco comments:    "Serial quitter" - currently smoking a couple a day  Vaping Use   Vaping Use: Some days  Substance and Sexual Activity   Alcohol use: Not Currently    Comment: social recently, heavy use in  her 84s, none recently   Drug use: Not Currently   Sexual activity: Yes  Other Topics Concern   Not on file  Social History Narrative   Not on file   Social Determinants of Health   Financial Resource Strain: Not on file  Food Insecurity: Not on file  Transportation Needs: Not on file  Physical Activity: Not on file  Stress: Not on file  Social Connections: Not on file  Intimate Partner Violence: Not on file      Marcial Pacas, M.D. Ph.D.  [facility] [address] [place], [place] [address] Ph: [phone] Fax: [phone]  CC:  Margarita Mail, PA-C No address on file  Pahwani, Michell Heinrich, MD

## 2022-11-07 NOTE — Telephone Encounter (Signed)
Pt came to check out, Dr. Krista Blue ordered a ncv/emg during the visit. I informed pt that Dr. Krista Blue is booking out until the end of February and that I could put her down for her first available and add her to the wait list. Pt was not happy that she is having to wait until the end of February for it to be done and would like to have it done sooner. I went ahead and scheduled her for 02/08/23 and added her to the wait list but would it be possible to work the patient in for a sooner time?

## 2022-11-07 NOTE — Telephone Encounter (Signed)
Please let patient's know, she will have MRI of the cervical corrugate to February 2024, if we see significant abnormalities, we may move up her EMG nerve conduction schedule

## 2022-11-07 NOTE — Telephone Encounter (Signed)
LVM inform pt that we will wait and see what MRI shows before we see about moving her up

## 2022-11-08 ENCOUNTER — Other Ambulatory Visit: Payer: Self-pay

## 2022-11-09 ENCOUNTER — Telehealth: Payer: Self-pay | Admitting: Neurology

## 2022-11-09 ENCOUNTER — Telehealth: Payer: Self-pay | Admitting: Hematology

## 2022-11-09 DIAGNOSIS — M5412 Radiculopathy, cervical region: Secondary | ICD-10-CM

## 2022-11-09 NOTE — Telephone Encounter (Signed)
Pt is calling. Stated she is f/u on MRI of her neck. Stated she was at hospital last friday because of the neck pain. Pt is requesting a call from nurse. Pt is requesting some pain medication.

## 2022-11-09 NOTE — Telephone Encounter (Signed)
With her [employer] plan, MRI is set up through [facility]. I faxed the order to them and they will call her with the location she will get her MRI done at. Their contact number is [phone]

## 2022-11-17 ENCOUNTER — Other Ambulatory Visit: Payer: Self-pay | Admitting: Internal Medicine

## 2022-11-17 ENCOUNTER — Ambulatory Visit
Admission: RE | Admit: 2022-11-17 | Discharge: 2022-11-17 | Disposition: A | Discharge status: Home / Self Care | Payer: BC Managed Care – PPO | Source: Ambulatory Visit | Attending: Internal Medicine | Admitting: Internal Medicine

## 2022-11-17 DIAGNOSIS — N644 Mastodynia: Secondary | ICD-10-CM

## 2022-11-23 ENCOUNTER — Encounter: Payer: Self-pay | Admitting: Neurology

## 2022-11-23 MED ORDER — GABAPENTIN 300 MG PO CAPS: 300.0000 mg | ORAL_CAPSULE | Freq: Three times a day (TID) | ORAL | 1 refills | Status: DC

## 2022-11-23 NOTE — Telephone Encounter (Signed)

## 2022-11-23 NOTE — Addendum Note (Signed)
Addended by: Marcial Pacas on: 11/23/2022 09:26 AM   Modules accepted: Orders

## 2022-11-25 ENCOUNTER — Other Ambulatory Visit: Payer: BC Managed Care – PPO

## 2022-11-28 ENCOUNTER — Telehealth: Payer: Self-pay

## 2022-11-28 ENCOUNTER — Other Ambulatory Visit: Payer: Self-pay | Admitting: Internal Medicine

## 2022-11-28 DIAGNOSIS — R9230 Dense breasts, unspecified: Secondary | ICD-10-CM

## 2022-11-28 DIAGNOSIS — Z1231 Encounter for screening mammogram for malignant neoplasm of breast: Secondary | ICD-10-CM

## 2022-11-28 NOTE — Telephone Encounter (Signed)
MRI Cervical Spine results received from [facility].  Results placed in MD's office for review.

## 2022-11-28 NOTE — Telephone Encounter (Signed)
I need MRI CD from [facility] to review the film

## 2022-11-29 NOTE — Telephone Encounter (Signed)
Please move up her EMG/NCS to my next 60 minutes slot and let her bring MRI cd from [facility] to review.   It is Ok to add on

## 2022-11-29 NOTE — Telephone Encounter (Signed)
Medical records to assist with obtaining MRI disk.

## 2022-12-06 ENCOUNTER — Ambulatory Visit (INDEPENDENT_AMBULATORY_CARE_PROVIDER_SITE_OTHER): Payer: BC Managed Care – PPO | Admitting: Neurology

## 2022-12-06 ENCOUNTER — Encounter: Payer: Self-pay | Admitting: Neurology

## 2022-12-06 ENCOUNTER — Other Ambulatory Visit: Payer: Self-pay | Admitting: Neurology

## 2022-12-06 ENCOUNTER — Encounter: Payer: BC Managed Care – PPO | Admitting: Neurology

## 2022-12-06 VITALS — BP 158/86 | HR 96 | Ht 69.0 in | Wt 152.0 lb

## 2022-12-06 DIAGNOSIS — M5412 Radiculopathy, cervical region: Secondary | ICD-10-CM

## 2022-12-06 DIAGNOSIS — M546 Pain in thoracic spine: Secondary | ICD-10-CM

## 2022-12-06 DIAGNOSIS — M792 Neuralgia and neuritis, unspecified: Secondary | ICD-10-CM

## 2022-12-06 DIAGNOSIS — R202 Paresthesia of skin: Secondary | ICD-10-CM

## 2022-12-06 MED ORDER — DULOXETINE HCL 30 MG PO CPEP: ORAL_CAPSULE | ORAL | 0 refills | Status: AC

## 2022-12-06 MED ORDER — DULOXETINE HCL 60 MG PO CPEP: 60.0000 mg | ORAL_CAPSULE | Freq: Every day | ORAL | 11 refills | Status: AC

## 2022-12-06 MED ORDER — DICLOFENAC SODIUM 1 % EX CREA: TOPICAL_CREAM | CUTANEOUS | 11 refills | Status: AC

## 2022-12-06 MED ORDER — LIDOCAINE-PRILOCAINE 2.5-2.5 % EX CREA: TOPICAL_CREAM | CUTANEOUS | 11 refills | Status: AC

## 2022-12-06 NOTE — Procedures (Signed)
Full Name: Talyn Eddie Gender: Female MRN #: [id number] Date of Birth: 1961-03-27    Visit Date: 12/06/2022 15:41 Age: 61 Years Examining Physician: Dr. Marcial Pacas Referring Physician: Dr. Marcial Pacas Height: 5 feet 9 inch History: 61 year old female presenting with subacute onset of lower cervical pain, radiating pain to her left armpit, also left breast, involving left lateral 3 fingers, left trunk, left lower extremity numbness  Summary of test: Nerve conduction study: Left median and ulnar sensory responses were normal.  Left sural, superficial peroneal sensory responses were normal.  Left peroneal to EDB and tibial motor responses were normal.  Electromyography: Selected needle examination of left upper extremity muscles, left cervical paraspinal muscles; left lower extremity muscles were normal.   Conclusion: This is a normal study.  There is no electrodiagnostic evidence of left upper extremity neuropathy, left cervical or lumbar radiculopathy.    ------------------------------- Marcial Pacas M.D. Ph.D.  [facility] [address] [place], [place] [address] Tel: [phone] Fax: [phone]  Verbal informed consent was obtained from the patient, patient was informed of potential risk of procedure, including bruising, bleeding, hematoma formation, infection, muscle weakness, muscle pain, numbness, among others.        [place]    Nerve / Sites Muscle Latency Ref. Amplitude Ref. Rel Amp Segments Distance Velocity Ref. Area    ms ms mV mV %  cm m/s m/s mVms  L Median - APB     Wrist APB 3.5 ?4.4 4.8 ?4.0 100 Wrist - APB 7   22.4     Upper arm APB 7.5  4.9  101 Upper arm - Wrist 18 45 ?49 21.1  L Ulnar - ADM     Wrist ADM 3.2 ?3.3 11.1 ?6.0 100 Wrist - ADM 7   27.9     B.Elbow ADM 5.4  9.6  86.4 B.Elbow - Wrist 12.5 57 ?49 27.7     A.Elbow ADM 7.9  8.8  91.9 A.Elbow - B.Elbow 15 62 ?49 27.3  L Peroneal - EDB     Ankle EDB 5.0  ?6.5 3.7 ?2.0 100 Ankle - EDB 9   14.2     Fib head EDB 11.1  3.1  84.2 Fib head - Ankle 27 44 ?44 13.1     Pop fossa EDB 15.0  2.3  73.8 Pop fossa - Fib head 17 44 ?44 12.9         Pop fossa - Ankle      L Tibial - AH     Ankle AH 3.1 ?5.8 14.0 ?4.0 100 Ankle - AH 9   35.1     Pop fossa AH 13.5  12.3  88 Pop fossa - Ankle 40 38 ?41 33.5             SNC    Nerve / Sites Rec. Site Peak Lat Ref.  Amp Ref. Segments Distance    ms ms V V  cm  L Sural - Ankle (Calf)     Calf Ankle 3.5 ?4.4 5 ?6 Calf - Ankle 14  L Superficial peroneal - Ankle     Lat leg Ankle 3.4 ?4.4 6 ?6 Lat leg - Ankle 14  L Median - Orthodromic (Dig II, Mid palm)     Dig II Wrist 3.3 ?3.4 11 ?10 Dig II - Wrist 13  L Ulnar - Orthodromic, (Dig V, Mid palm)     Dig V Wrist 3.0 ?3.1 7 ?5 Dig V -  Wrist 42             F  Wave    Nerve F Lat Ref.   ms ms  L Ulnar - ADM 27.0 ?32.0  L Tibial - AH 51.7 ?56.0         EMG Summary Table    Spontaneous MUAP Recruitment  Muscle IA Fib PSW Fasc Other Amp Dur. Poly Pattern  L. Tibialis anterior Normal None None None _______ Normal Normal Normal Normal  L. Tibialis posterior Normal None None None _______ Normal Normal Normal Normal  L. Peroneus longus Normal None None None _______ Normal Normal Normal Normal  L. Gastrocnemius (Medial head) Normal None None None _______ Normal Normal Normal Normal  L. Vastus lateralis Normal None None None _______ Normal Normal Normal Normal  L. First dorsal interosseous Normal None None None _______ Normal Normal Normal Normal  L. Biceps brachii Normal None None None _______ Normal Normal Normal Normal  L. Deltoid Normal None None None _______ Normal Normal Normal Normal  L. Triceps brachii Normal None None None _______ Normal Normal Normal Normal  L. Brachioradialis Normal None None None _______ Normal Normal Normal Normal  L. Extensor digitorum communis Normal None None None _______ Normal Normal Normal Normal  L. Cervical paraspinals  Normal None None None _______ Normal Normal Normal Normal  L. Thoracic paraspinals (up) Normal None None None _______ Normal Normal Normal Normal

## 2022-12-06 NOTE — Telephone Encounter (Signed)
MR BREAST BILATERAL W WO CONTRAST INC CAD   Scheduled  Expires: 11/29/2023  Ordered: 11/28/2022  Provider: Mckinley Jewel, MD  Referral: [phone] (Pending Review)  Dept: GI-[facility] ([facility])  Dx: Dense breast [R92.2, R92.30]; Encounter for screening mammogram for high-risk patient [Z12.31]   I ordered MRI thoracic w/wo, please let her have it at the same time with MRI of breat on Dec 28th 8am at [facility].

## 2022-12-06 NOTE — Progress Notes (Signed)
Orders Placed This Encounter  Procedures   Varicella-zoster by PCR   C-reactive protein   RPR   Vitamin B12   HIV Antibody (routine testing w rflx)   Sedimentation rate   ANA w/Reflex if Positive   Varicella zoster antibody, IgG   Varicella zoster antibody, IgM

## 2022-12-06 NOTE — Addendum Note (Signed)
Addended by: Marcial Pacas on: 12/06/2022 05:21 PM   Modules accepted: Orders, Level of Service

## 2022-12-06 NOTE — Progress Notes (Addendum)
Chief Complaint  Patient presents with   Procedure    EMG/NCV 3.      ASSESSMENT AND PLAN  Rachel Weber is a 61 y.o. female   Acute onset of left lower neck pain, radiating pain to left axillary region, also involving left chest,  History of shingles, this time there was no noticeable rash broke out,  MRI of cervical spine showed degenerative changes, no significant canal foraminal stenosis to explain above findings,  Most suggestive of herpes neuralgia, even though there was no clear rash broke out, she is way past the treatment window for antivirals and steroid, will focus on symptom control,  Cymbalta 30 mg titrating to 60 mg daily, gabapentin 300 mg 3 times daily,  Lidocaine cream may mix with diclofenac cream, warm compression,  MRI of breast is pending, will add on MRI of thoracic spine with her hyperreflexia radiating paresthesia along her left trunk and left lower extremity to rule out left thoracic pathology  Return To Clinic With NP In 3 Months   DIAGNOSTIC DATA (LABS, IMAGING, TESTING) - I reviewed patient records, labs, notes, testing and imaging myself where available.   MEDICAL HISTORY:  Rachel Weber is a 61 year old right-handed female, seen in request by his primary care doctor Pahwani, Rinka for evaluation of left neck pain, radiating pain to left side of the body, initial evaluation November 07, 2022  I reviewed and summarized the referring note. PMHX Celiac Anxiety, mother died of stroke 12-Oct-2022,   She was diagnosed with celiac disease since beginning of 2023, presenting with severe diarrhea, now improved with 3 months treatment of steroid, on tapering dose,  She went through very stressful few weeks, lost her mother on 10-12-2022, travel to [place] to take care of the family related affairs, she was very distorted, not sleeping well,  About a week ago, in the middle of November 2023, when she came back from the trip, she noticed  fairly acute onset left neck pain, radiating pain to left arm, left chest pain, paresthesia and radiating discomfort also involving left trunk, left lower extremity, no left face involvement.  She presented to the emergency room November 04, 2022, had extensive evaluations, personally reviewed MRI of the brain, that was normal CT angiogram of the chest, abdomen, pelvic showed no significant abnormalities, evidence of mild atherosclerotic calcification of the infrarenal aorta and branch vessels, coronary artery atherosclerotic calcification, CT chest showed centrilobular and paraseptal emphysema, smoking-related respiratory bronchitis, she has a long history of smoking,  UPDATE Dec 06 2022: Over the past few weeks, her symptoms have mild improvement, paresthesia is more localized to left arm pit, breast, radiating to left 3-5th finger, still has numbness at left trunk and leg, mild gait disturbance contributed to left leg discomfort, left ankle pain from previous fracture, she denies bowel and bladder incontinence, no right arm involvement  Complaining discomfort is back to her left breast, had a history of bilateral breast reduction in 2015,scar tissue in breast, pending MRI of breast w/wo contrast on Dec 28.  She has been on Tylenol 3 prn.since her left ankle fracture since 2010, did not try gabapentin or about the side effect  She had a history of chickenpox, denies rash broke out,  PHYSICAL EXAM:   Vitals:   12/06/22 1535  BP: (!) 158/86  Pulse: 96  Weight: 152 lb (68.9 kg)  Height: '5\' 9"'$  (1.753 m)   Body mass index is 22.45 kg/m.  PHYSICAL EXAMNIATION:  Gen: NAD,  conversant, well nourised, well groomed                     Cardiovascular: Regular rate rhythm, no peripheral edema, warm, nontender. Eyes: Conjunctivae clear without exudates or hemorrhage Neck: Supple, no carotid bruits. Pulmonary: Clear to auscultation bilaterally   NEUROLOGICAL EXAM:  MENTAL  STATUS: Speech/cognition: Awake, alert, oriented to history taking and casual conversation CRANIAL NERVES: CN II: Visual fields are full to confrontation. Pupils are round equal and briskly reactive to light. CN III, IV, VI: extraocular movement are normal. No ptosis. CN V: Facial sensation is intact to light touch CN VII: Face is symmetric with normal eye closure  CN VIII: Hearing is normal to causal conversation. CN IX, X: Phonation is normal. CN XI: Head turning and shoulder shrug are intact  MOTOR: No significant bilateral upper or lower extremity muscle weakness  REFLEXES: Reflexes are 2+ and symmetric at the biceps, triceps, 3/3 knees, and ankles. Plantar responses are flexor.  SENSORY: Intact to light touch, pinprick and vibratory sensation are intact in fingers and toes.  COORDINATION: There is no trunk or limb dysmetria noted.  GAIT/STANCE: Posture is normal. Gait is steady with normal steps, base, arm swing, and turning. Heel and toe walking are normal. Tandem gait is normal.  Romberg is absent.  REVIEW OF SYSTEMS:  Full 14 system review of systems performed and notable only for as above All other review of systems were negative.   ALLERGIES: Allergies  Allergen Reactions   Flagyl [Metronidazole] Nausea And Vomiting   Hydrocodone Other (See Comments)    Hallucinations   Oxycontin [Oxycodone Hcl] Nausea And Vomiting and Other (See Comments)    Hallucinations    HOME MEDICATIONS: Current Outpatient Medications  Medication Sig Dispense Refill   acetaminophen-codeine (TYLENOL #3) 300-30 MG tablet Take 1-2 tablets by mouth See admin instructions. Take 1-2 tablets by mouth 2 to 3 times daily as needed for pain     albuterol (PROVENTIL HFA;VENTOLIN HFA) 108 (90 Base) MCG/ACT inhaler INHALE 1-2 PUFFS INTO THE LUNGS EVERY 4 (FOUR) HOURS AS NEEDED FOR WHEEZING OR SHORTNESS OF BREATH. 6.7 Inhaler 0   BANOPHEN 25 MG capsule Take 25 mg by mouth 2 (two) times daily as  needed for allergies.     gabapentin (NEURONTIN) 300 MG capsule Take 1 capsule (300 mg total) by mouth 3 (three) times daily. 90 capsule 1   ondansetron (ZOFRAN) 4 MG tablet Take 1 tablet (4 mg total) by mouth daily as needed for nausea or vomiting. 30 tablet 1   temazepam (RESTORIL) 15 MG capsule Take 15-30 mg by mouth at bedtime as needed for sleep.     No current facility-administered medications for this visit.    PAST MEDICAL HISTORY: Past Medical History:  Diagnosis Date   Allergy    Anxiety    Cataract    TMJ arthritis     PAST SURGICAL HISTORY: Past Surgical History:  Procedure Laterality Date   APPENDECTOMY     BIOPSY  08/16/2022   Procedure: BIOPSY;  Surgeon: [name], MD;  Location: [facility] ENDOSCOPY;  Service: Gastroenterology;;   BREAST SURGERY     COLONOSCOPY WITH PROPOFOL N/A 08/16/2022   Procedure: COLONOSCOPY WITH PROPOFOL;  Surgeon: [name], MD;  Location: [place];  Service: Gastroenterology;  Laterality: N/A;   COSMETIC SURGERY     ESOPHAGOGASTRODUODENOSCOPY (EGD) WITH PROPOFOL N/A 08/16/2022   Procedure: ESOPHAGOGASTRODUODENOSCOPY (EGD) WITH PROPOFOL;  Surgeon: [name], MD;  Location: [place];  Service:  Gastroenterology;  Laterality: N/A;   POLYPECTOMY  08/16/2022   Procedure: POLYPECTOMY;  Surgeon: [name], MD;  Location: [facility] ENDOSCOPY;  Service: Gastroenterology;;    FAMILY HISTORY: Family History  Problem Relation Age of Onset   Breast cancer Mother        50s   Cancer Mother    Bone cancer Maternal Grandmother     SOCIAL HISTORY: Social History   Socioeconomic History   Marital status: Married    Spouse name: Not on file   Number of children: 1   Years of education: Not on file   Highest education level: Not on file  Occupational History   Occupation: Secretary/administrator    Comment: from [place]  Tobacco Use   Smoking status: Every Day    Packs/day: 1.00    Years: 30.00    Total pack years: 30.00    Types: Cigarettes    Smokeless tobacco: Never   Tobacco comments:    "Serial quitter" - currently smoking a couple a day  Vaping Use   Vaping Use: Some days  Substance and Sexual Activity   Alcohol use: Not Currently    Comment: social recently, heavy use in her 68s, none recently   Drug use: Not Currently   Sexual activity: Yes  Other Topics Concern   Not on file  Social History Narrative   Not on file   Social Determinants of Health   Financial Resource Strain: Not on file  Food Insecurity: Not on file  Transportation Needs: Not on file  Physical Activity: Not on file  Stress: Not on file  Social Connections: Not on file  Intimate Partner Violence: Not on file      Marcial Pacas, M.D. Ph.D.  [facility] [address], [place], [place] [address] Ph: [phone] Fax: [phone]  CC:  Mckinley Jewel, MD [address] [place],  [place] [address]  Mckinley Jewel, MD

## 2022-12-07 ENCOUNTER — Other Ambulatory Visit: Payer: Self-pay | Admitting: Neurology

## 2022-12-07 LAB — ANA W/REFLEX IF POSITIVE: Anti Nuclear Antibody (ANA): NEGATIVE

## 2022-12-07 LAB — VITAMIN B12: Vitamin B-12: 449 pg/mL (ref 232–1245)

## 2022-12-07 LAB — SEDIMENTATION RATE: Sed Rate: 4 mm/hr (ref 0–40)

## 2022-12-07 LAB — VARICELLA ZOSTER ANTIBODY, IGG: Varicella zoster IgG: 1185 index (ref >165)

## 2022-12-07 LAB — VARICELLA ZOSTER ANTIBODY, IGM: Varicella IgM: <0.91 index (ref 0.00–0.90)

## 2022-12-07 LAB — C-REACTIVE PROTEIN: CRP: 1 mg/L (ref 0–10)

## 2022-12-07 LAB — HIV ANTIBODY (ROUTINE TESTING W REFLEX): HIV Screen 4th Generation wRfx: NONREACTIVE

## 2022-12-07 LAB — RPR: RPR Ser Ql: NONREACTIVE

## 2022-12-07 NOTE — Telephone Encounter (Signed)
I sent a message to GI asking them to schedule this MRI with the MRI breast she is already scheduled for. I also sent the MRI order to [facility] per her [employer] plan.

## 2022-12-07 NOTE — Telephone Encounter (Signed)
Let us know if this is an issue. thanks

## 2022-12-08 MED ORDER — GABAPENTIN 300 MG PO CAPS: 300.0000 mg | ORAL_CAPSULE | Freq: Three times a day (TID) | ORAL | 1 refills | Status: AC

## 2022-12-09 ENCOUNTER — Encounter: Payer: Self-pay | Admitting: Neurology

## 2022-12-14 ENCOUNTER — Encounter: Payer: Self-pay | Admitting: Neurology

## 2022-12-14 ENCOUNTER — Telehealth: Payer: Self-pay | Admitting: Neurology

## 2022-12-14 NOTE — Telephone Encounter (Signed)
Pt is asking for a call with results to shingles test and results to blood work and verve study.  Pt states she is unable to do anything on Gabapentin.  Pt is asking for a call

## 2022-12-14 NOTE — Telephone Encounter (Signed)
Pt also sent this question via mychart. I have sent to Dr. Krista Blue for review.

## 2022-12-15 ENCOUNTER — Inpatient Hospital Stay: Admission: RE | Admit: 2022-12-15 | Payer: BC Managed Care – PPO | Source: Ambulatory Visit

## 2022-12-16 ENCOUNTER — Encounter: Payer: Self-pay | Admitting: Neurology

## 2022-12-20 ENCOUNTER — Encounter: Payer: Self-pay | Admitting: Neurology

## 2022-12-21 ENCOUNTER — Other Ambulatory Visit: Payer: BC Managed Care – PPO

## 2022-12-26 ENCOUNTER — Telehealth: Payer: Self-pay | Admitting: Neurology

## 2022-12-26 NOTE — Telephone Encounter (Signed)
[name] is calling. Stated she needs orders for testing. Please fax to [phone]

## 2022-12-28 NOTE — Telephone Encounter (Signed)
[facility] network [name]) did not received faxed document. Need copy the referral to be schedule appt. Contact info [phone]. Fax: [phone]

## 2022-12-28 NOTE — Telephone Encounter (Signed)
[name]) need copy of referral to be able to schedule appt.  Transferred to [facility]

## 2022-12-28 NOTE — Telephone Encounter (Signed)
MRI ordered faxed to [facility]

## 2022-12-28 NOTE — Telephone Encounter (Signed)
I received a fax from [facility] that she is scheduled for 02/04/23 at 9:50am at  [place].  [place]  [phone]

## 2023-01-17 ENCOUNTER — Other Ambulatory Visit: Payer: Self-pay | Admitting: Nurse Practitioner

## 2023-01-17 DIAGNOSIS — M5416 Radiculopathy, lumbar region: Secondary | ICD-10-CM

## 2023-02-08 ENCOUNTER — Encounter: Payer: BC Managed Care – PPO | Admitting: Neurology

## 2023-02-09 ENCOUNTER — Ambulatory Visit: Payer: BC Managed Care – PPO | Admitting: Adult Health

## 2023-02-20 ENCOUNTER — Ambulatory Visit: Payer: BC Managed Care – PPO | Admitting: Family Medicine

## 2023-03-26 ENCOUNTER — Emergency Department (HOSPITAL_BASED_OUTPATIENT_CLINIC_OR_DEPARTMENT_OTHER)
Admission: EM | Admit: 2023-03-26 | Discharge: 2023-03-26 | Disposition: A | Discharge status: Home / Self Care | Payer: BC Managed Care – PPO | Attending: Emergency Medicine | Admitting: Emergency Medicine

## 2023-03-26 DIAGNOSIS — R6884 Jaw pain: Secondary | ICD-10-CM | POA: Diagnosis present

## 2023-03-26 MED ORDER — KETOROLAC TROMETHAMINE 15 MG/ML IJ SOLN
15.0000 mg | Freq: Once | INTRAMUSCULAR | Status: AC
  Administered 2023-03-26: 15 mg via INTRAMUSCULAR
  Filled 2023-03-26: qty 1

## 2023-03-26 NOTE — Discharge Instructions (Signed)
Take 4 over the counter ibuprofen tablets 3 times a day or 2 over-the-counter naproxen tablets twice a day for pain. Also take tylenol 1000mg (2 extra strength) four times a day.    Follow up with your dentist in the office.

## 2023-03-26 NOTE — ED Triage Notes (Signed)
Pt reports jaw misalignment since yesterday, preventing her from being able to eat without intense pain.  H/o tmj.

## 2023-03-26 NOTE — ED Provider Notes (Signed)
[place] EMERGENCY DEPARTMENT AT [facility] Provider Note   CSN: [id number] Arrival date & time: 03/26/23  1036     History  Chief Complaint  Patient presents with   Jaw Pain    Rachel Weber is a 62 y.o. female.  62 yo F with a cc of jaw pain.  This been going on for a few days.  Right-sided.  She said she was inadvertently clenching her jaw and she noticed it before she started having discomfort.  She unfortunately has trouble with her teeth and has planned implant surgery on the left side and so has been chewing things mostly on the right.  She denies any current issue with her teeth.  She was worried because she was trying to eat cereal this morning and felt like her jaw did not quite line up and came here for evaluation.        Home Medications Prior to Admission medications   Medication Sig Start Date End Date Taking? Authorizing Provider  acetaminophen-codeine (TYLENOL #3) 300-30 MG tablet Take 1-2 tablets by mouth See admin instructions. Take 1-2 tablets by mouth 2 to 3 times daily as needed for pain 01/16/20   [provider]  albuterol (PROVENTIL HFA;VENTOLIN HFA) 108 (90 Base) MCG/ACT inhaler INHALE 1-2 PUFFS INTO THE LUNGS EVERY 4 (FOUR) HOURS AS NEEDED FOR WHEEZING OR SHORTNESS OF BREATH. 07/16/18   Shade Flood, MD  Diclofenac Sodium 1 % CREA 1 gram qid prn 12/06/22   Levert Feinstein, MD  DULoxetine (CYMBALTA) 30 MG capsule Fill her Cymbalta 30mg  daily first 12/06/22   Levert Feinstein, MD  DULoxetine (CYMBALTA) 60 MG capsule Take 1 capsule (60 mg total) by mouth daily. 12/06/22   Levert Feinstein, MD  gabapentin (NEURONTIN) 300 MG capsule Take 1 capsule (300 mg total) by mouth 3 (three) times daily. 12/08/22   Levert Feinstein, MD  lidocaine-prilocaine (EMLA) cream 1 gram qid prn 12/06/22   Levert Feinstein, MD  ondansetron (ZOFRAN) 4 MG tablet Take 1 tablet (4 mg total) by mouth daily as needed for nausea or vomiting. 08/16/22 08/16/23  Azucena Fallen, MD   temazepam (RESTORIL) 15 MG capsule Take 15-30 mg by mouth at bedtime as needed for sleep. 07/16/22   [provider]      Allergies    Flagyl [metronidazole], Hydrocodone, and Oxycontin [oxycodone hcl]    Review of Systems   Review of Systems  Physical Exam Updated Vital Signs BP 138/84 (BP Location: Right Arm)   Pulse 98   Temp 98.7 F (37.1 C) (Oral)   Resp 18   SpO2 96%  Physical Exam Vitals and nursing note reviewed.  Constitutional:      General: She is not in acute distress.    Appearance: She is well-developed. She is not diaphoretic.  HENT:     Head: Normocephalic and atraumatic.     Mouth/Throat:     Comments: Multiple missing teeth.  She is able to open her mouth quite wide especially when she talks and laughs.  Mild pain about the right TMJ.  Right TM is normal. Eyes:     Pupils: Pupils are equal, round, and reactive to light.  Cardiovascular:     Rate and Rhythm: Normal rate and regular rhythm.     Heart sounds: No murmur heard.    No friction rub. No gallop.  Pulmonary:     Effort: Pulmonary effort is normal.     Breath sounds: No wheezing or rales.  Abdominal:  General: There is no distension.     Palpations: Abdomen is soft.     Tenderness: There is no abdominal tenderness.  Musculoskeletal:        General: No tenderness.     Cervical back: Normal range of motion and neck supple.  Skin:    General: Skin is warm and dry.  Neurological:     Mental Status: She is alert and oriented to person, place, and time.  Psychiatric:        Behavior: Behavior normal.     ED Results / Procedures / Treatments   Labs (all labs ordered are listed, but only abnormal results are displayed) Labs Reviewed - No data to display  EKG None  Radiology No results found.  Procedures Procedures    Medications Ordered in ED Medications  ketorolac (TORADOL) 15 MG/ML injection 15 mg (15 mg Intramuscular Given 03/26/23 1148)    ED Course/ Medical  Decision Making/ A&P                             Medical Decision Making Risk Prescription drug management.   62 yo F with a chief complaints of right jaw pain.  This been going on for about 3 to 4 days.  She was having trouble eating cereal this morning and she thought maybe she had dislocated her jaw and so came to the ED to be evaluated.  She has very good range of motion of the jaw without any obvious significant pain at the TMJ.  I suspect it is very unlikely to be dislocated.  I am going to treat her supportively.  Soft diet.  Dentistry follow-up.  12:08 PM:  I have discussed the diagnosis/risks/treatment options with the patient.  Evaluation and diagnostic testing in the emergency department does not suggest an emergent condition requiring admission or immediate intervention beyond what has been performed at this time.  They will follow up with Dentistry. We also discussed returning to the ED immediately if new or worsening sx occur. We discussed the sx which are most concerning (e.g., sudden worsening pain, fever, inability to tolerate by mouth) that necessitate immediate return. Medications administered to the patient during their visit and any new prescriptions provided to the patient are listed below.  Medications given during this visit Medications  ketorolac (TORADOL) 15 MG/ML injection 15 mg (15 mg Intramuscular Given 03/26/23 1148)     The patient appears reasonably screen and/or stabilized for discharge and I doubt any other medical condition or other [facility] requiring further screening, evaluation, or treatment in the ED at this time prior to discharge.          Final Clinical Impression(s) / ED Diagnoses Final diagnoses:  Jaw pain    Rx / DC Orders ED Discharge Orders     None         Melene Plan, DO 03/26/23 1208

## 2024-05-31 ENCOUNTER — Other Ambulatory Visit: Payer: Self-pay | Admitting: Neurology
